# Patient Record
Sex: Male | Born: 1960 | Race: White | Hispanic: No | Marital: Single | Smoking: Never smoker
Health system: Southern US, Community
[De-identification: ages and names within clinical notes are randomized; demographics above are authoritative.]

## PROBLEM LIST (undated history)

## (undated) DIAGNOSIS — T7840XA Allergy, unspecified, initial encounter: Secondary | ICD-10-CM

## (undated) DIAGNOSIS — N529 Male erectile dysfunction, unspecified: Secondary | ICD-10-CM

## (undated) DIAGNOSIS — I82409 Acute embolism and thrombosis of unspecified deep veins of unspecified lower extremity: Secondary | ICD-10-CM

## (undated) DIAGNOSIS — D689 Coagulation defect, unspecified: Secondary | ICD-10-CM

## (undated) DIAGNOSIS — E785 Hyperlipidemia, unspecified: Secondary | ICD-10-CM

## (undated) DIAGNOSIS — H269 Unspecified cataract: Secondary | ICD-10-CM

## (undated) HISTORY — PX: CATARACT EXTRACTION, BILATERAL: SHX1313

## (undated) HISTORY — DX: Unspecified cataract: H26.9

## (undated) HISTORY — DX: Allergy, unspecified, initial encounter: T78.40XA

## (undated) HISTORY — DX: Hyperlipidemia, unspecified: E78.5

## (undated) HISTORY — DX: Male erectile dysfunction, unspecified: N52.9

## (undated) HISTORY — DX: Coagulation defect, unspecified: D68.9

## (undated) HISTORY — DX: Acute embolism and thrombosis of unspecified deep veins of unspecified lower extremity: I82.409

## (undated) HISTORY — PX: WISDOM TOOTH EXTRACTION: SHX21

## (undated) HISTORY — PX: COLONOSCOPY: SHX174

## (undated) HISTORY — PX: POLYPECTOMY: SHX149

---

## 1997-01-21 ENCOUNTER — Encounter: Payer: Self-pay | Admitting: Family Medicine

## 1997-04-30 HISTORY — PX: TENDON REPAIR: SHX5111

## 2002-01-08 ENCOUNTER — Encounter: Payer: Self-pay | Admitting: Family Medicine

## 2002-01-08 LAB — CONVERTED CEMR LAB
Blood Glucose, Fasting: 96 mg/dL
RBC count: 5.28 10*6/uL
TSH: 2.18 microintl units/mL
WBC, blood: 4.2 10*3/uL

## 2003-01-08 ENCOUNTER — Encounter: Payer: Self-pay | Admitting: Family Medicine

## 2003-01-08 LAB — CONVERTED CEMR LAB
Blood Glucose, Fasting: 98 mg/dL
Blood Glucose, Fasting: 98 mg/dL

## 2004-05-12 ENCOUNTER — Ambulatory Visit: Payer: Self-pay | Admitting: Family Medicine

## 2004-05-12 LAB — CONVERTED CEMR LAB
RBC count: 5.13 10*6/uL
WBC, blood: 5 10*3/uL

## 2004-05-19 ENCOUNTER — Ambulatory Visit: Payer: Self-pay | Admitting: Family Medicine

## 2005-05-25 ENCOUNTER — Ambulatory Visit: Payer: Self-pay | Admitting: Family Medicine

## 2005-05-25 LAB — CONVERTED CEMR LAB
Blood Glucose, Fasting: 103 mg/dL
Microalbumin U total vol: 0.9 mg/L
TSH: 3.14 microintl units/mL

## 2005-06-08 ENCOUNTER — Ambulatory Visit: Payer: Self-pay | Admitting: Family Medicine

## 2005-11-30 ENCOUNTER — Ambulatory Visit: Payer: Self-pay | Admitting: Family Medicine

## 2006-06-14 ENCOUNTER — Ambulatory Visit: Payer: Self-pay | Admitting: Family Medicine

## 2006-06-14 LAB — CONVERTED CEMR LAB
AST: 20 units/L (ref 0–37)
Albumin: 4.1 g/dL (ref 3.5–5.2)
Bilirubin, Direct: 0.1 mg/dL (ref 0.0–0.3)
Chloride: 102 meq/L (ref 96–112)
Cholesterol: 168 mg/dL (ref 0–200)
GFR calc non Af Amer: 70 mL/min
Glucose, Bld: 102 mg/dL — ABNORMAL HIGH (ref 70–99)
HDL: 39.3 mg/dL (ref >39.0)
LDL Cholesterol: 117 mg/dL — ABNORMAL HIGH (ref 0–99)
Potassium: 3.9 meq/L (ref 3.5–5.1)
Sodium: 141 meq/L (ref 135–145)
TSH: 4.56 microintl units/mL (ref 0.35–5.50)
Total CHOL/HDL Ratio: 4.3
Triglycerides: 61 mg/dL (ref 0–149)
VLDL: 12 mg/dL (ref 0–40)

## 2006-06-21 ENCOUNTER — Ambulatory Visit: Payer: Self-pay | Admitting: Family Medicine

## 2006-09-12 ENCOUNTER — Telehealth (INDEPENDENT_AMBULATORY_CARE_PROVIDER_SITE_OTHER): Payer: Self-pay | Admitting: *Deleted

## 2007-07-02 ENCOUNTER — Ambulatory Visit: Payer: Self-pay | Admitting: Family Medicine

## 2007-07-03 LAB — CONVERTED CEMR LAB
ALT: 21 units/L (ref 0–53)
Alkaline Phosphatase: 70 units/L (ref 39–117)
BUN: 17 mg/dL (ref 6–23)
Bilirubin, Direct: 0.1 mg/dL (ref 0.0–0.3)
CO2: 29 meq/L (ref 19–32)
Calcium: 9.5 mg/dL (ref 8.4–10.5)
Cholesterol: 160 mg/dL (ref 0–200)
Creatinine, Ser: 1.2 mg/dL (ref 0.4–1.5)
Glucose, Bld: 95 mg/dL (ref 70–99)
Potassium: 4.2 meq/L (ref 3.5–5.1)
Total Bilirubin: 0.8 mg/dL (ref 0.3–1.2)
Total CHOL/HDL Ratio: 4.2
Total Protein: 6.6 g/dL (ref 6.0–8.3)
Triglycerides: 39 mg/dL (ref 0–149)

## 2007-07-04 ENCOUNTER — Ambulatory Visit: Payer: Self-pay | Admitting: Family Medicine

## 2007-07-04 ENCOUNTER — Encounter: Payer: Self-pay | Admitting: Family Medicine

## 2007-07-04 DIAGNOSIS — E749 Disorder of carbohydrate metabolism, unspecified: Secondary | ICD-10-CM | POA: Insufficient documentation

## 2007-07-04 DIAGNOSIS — E7800 Pure hypercholesterolemia, unspecified: Secondary | ICD-10-CM | POA: Insufficient documentation

## 2008-07-07 ENCOUNTER — Ambulatory Visit: Payer: Self-pay | Admitting: Family Medicine

## 2008-07-08 LAB — CONVERTED CEMR LAB
AST: 19 units/L (ref 0–37)
Bilirubin, Direct: 0.1 mg/dL (ref 0.0–0.3)
Chloride: 103 meq/L (ref 96–112)
Creatinine, Ser: 1.1 mg/dL (ref 0.4–1.5)
Creatinine,U: 203.1 mg/dL
GFR calc non Af Amer: 77 mL/min
LDL Cholesterol: 110 mg/dL — ABNORMAL HIGH (ref 0–99)
PSA: 0.22 ng/mL (ref 0.10–4.00)
Potassium: 4 meq/L (ref 3.5–5.1)
Sodium: 139 meq/L (ref 135–145)
TSH: 3.34 microintl units/mL (ref 0.35–5.50)
Total Bilirubin: 1.4 mg/dL — ABNORMAL HIGH (ref 0.3–1.2)
Total CHOL/HDL Ratio: 4.9
Triglycerides: 72 mg/dL (ref 0–149)

## 2008-07-12 ENCOUNTER — Ambulatory Visit: Payer: Self-pay | Admitting: Family Medicine

## 2009-07-14 ENCOUNTER — Ambulatory Visit: Payer: Self-pay | Admitting: Family Medicine

## 2009-07-14 ENCOUNTER — Telehealth (INDEPENDENT_AMBULATORY_CARE_PROVIDER_SITE_OTHER): Payer: Self-pay | Admitting: *Deleted

## 2009-07-14 LAB — CONVERTED CEMR LAB
AST: 26 units/L (ref 0–37)
Albumin: 3.9 g/dL (ref 3.5–5.2)
Alkaline Phosphatase: 65 units/L (ref 39–117)
CO2: 31 meq/L (ref 19–32)
Calcium: 9 mg/dL (ref 8.4–10.5)
Glucose, Bld: 99 mg/dL (ref 70–99)
HDL: 38.2 mg/dL — ABNORMAL LOW (ref >39.00)
Potassium: 4.2 meq/L (ref 3.5–5.1)
Sodium: 140 meq/L (ref 135–145)
Total Protein: 6.2 g/dL (ref 6.0–8.3)

## 2009-07-28 ENCOUNTER — Ambulatory Visit: Payer: Self-pay | Admitting: Family Medicine

## 2009-12-06 ENCOUNTER — Encounter (INDEPENDENT_AMBULATORY_CARE_PROVIDER_SITE_OTHER): Payer: Self-pay | Admitting: *Deleted

## 2010-05-04 ENCOUNTER — Telehealth: Payer: Self-pay | Admitting: Family Medicine

## 2010-05-30 NOTE — Letter (Signed)
Summary: Nadara Eaton letter  [place] [facility]  [address] [place], [place] [address]   Phone: [phone]  Fax: [phone]       12/06/2009 MRN: [id number]  Christopher Gray [address] [place], [place]  [address]  Dear [name] and [facility] announce the retirement of Arta Silence, M.D., from full-time practice at the [facility] office effective October 27, 2009 and his plans of returning part-time.  It is important to Dr. Hetty Ely and to our practice that you understand that [facility] has seven physicians in our office for your health care needs.  We will continue to offer the same exceptional care that you have today.    Dr. Hetty Ely has spoken to many of you about his plans for retirement and returning part-time in the fall.   We will continue to work with you through the transition to schedule appointments for you in the office and meet the high standards that [facility] is committed to.   Again, it is with great pleasure that we share the news that Dr. Hetty Ely will return to [facility] in October of 2011 with a reduced schedule.    If you have any questions, or would like to request an appointment with one of our physicians, please call us at [phone] and press the option for Scheduling an appointment.  We take pleasure in providing you with excellent patient care and look forward to seeing you at your next office visit.  Our [facility] Physicians are:  Tillman Abide, M.D. Laurita Quint, M.D. Roxy Manns, M.D. Kerby Nora, M.D. Hannah Beat, M.D. Ruthe Mannan, M.D. We proudly welcomed Raechel Ache, M.D. and Eustaquio Boyden, M.D. to the practice in July/August 2011.  Sincerely,  [facility]

## 2010-05-30 NOTE — Assessment & Plan Note (Signed)
Summary: CPX/RBH  R/S FROM 07/18/09   Vital Signs:  Patient profile:   50 year old male Height:      72 inches Weight:      195.75 pounds BMI:     26.64 Temp:     97.9 degrees F oral Pulse rate:   60 / minute Pulse rhythm:   regular BP sitting:   110 / 70  (left arm) Cuff size:   regular  Vitals Entered By: Sydell Axon LPN (July 28, 2009 10:52 AM) CC: 30 Minute checkup   History of Present Illness: Pt here for Comp Exam. He has rash that pops up and go away for a few days.  He has a mole on his back. His finger was hit in the mail and had a paper cut a few weeks ago. He started soaking in Epsom salts. It is getting better.   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 1 Daily By Mouth 2)  Glucosamine Chondr 500 Complex   Caps (Glucosamine-Chondroit-Vit C-Mn) .Marland Kitchen.. 1 Daily By Mouth 3)  Fish Oil 1000 Mg Caps (Omega-3 Fatty Acids) .... Twice A Day To Three Times Per Day  Allergies: No Known Drug Allergies  Past History:  Past Surgical History: Last updated: 07/04/2007 Crushed right little finger with tendon repair  1999  Family History: Last updated: 07/12/2008 Father dec 73 Brain tumors  Prior smoker(stroke lung cancer with brain mets.) MOTHER dec  60s UNKNOWN PRIMARY CANCER--BONE CANCER Sister dec 43 Ovarian Ca CV: NEGATIVE HBP: NEGATIVE DM:  NEGATIVE GOUT/ARTHRITIS: PROSTATE CANCER: LUNG CANCER FATHER?? POSS. MOTHER ALSO BREAST/OVARIAN/UTERINE CANCER: +MOTHER BONE CANCER// +SISTER  OVARIAN CANCER COLON CANCER: DEPRESSION: NEGATIVE ETOH/DRUG ABUSE : NEGATIVE OTHER: +STROKE, FATHER  Social History: Last updated: 07/28/2009 Single, active sexually one partner 5 years Occupation:[employer]  Risk Factors: Alcohol Use: 0 (07/28/2009) Caffeine Use: 1-2 (07/28/2009) Exercise: yes (07/28/2009)  Risk Factors: Smoking Status: never (07/28/2009) Passive Smoke Exposure: no (07/28/2009)  Social History: Single, active sexually one partner 5 years Occupation:[employer]  Review of Systems General:  Complains of fatigue; denies chills, fever, sweats, weakness, and weight loss; Doesn't get enough sleep.. Eyes:  Complains of blurring; denies discharge and eye pain; right eye fuzzy. Used to see Dr Orson Slick. Now goes to Dr in [place].. ENT:  Denies decreased hearing, earache, and ringing in ears. CV:  Denies chest pain or discomfort, fainting, palpitations, shortness of breath with exertion, and swelling of feet. Resp:  Denies chest pain with inspiration, cough, shortness of breath, and wheezing. GI:  Denies abdominal pain, bloody stools, change in bowel habits, constipation, dark tarry stools, diarrhea, indigestion, loss of appetite, nausea, vomiting, vomiting blood, and yellowish skin color. GU:  Complains of nocturia; denies discharge, dysuria, and urinary frequency; one to two. MS:  Complains of low back pain; denies joint  pain, muscle aches, cramps, and stiffness; occas from working out.. Derm:  Complains of rash; denies dryness and itching; wandering single papule inflamm. Neuro:  Denies memory loss, numbness, poor balance, tingling, and tremors.  Physical Exam  General:  Well-developed,well-nourished,in no acute distress; alert,appropriate and cooperative throughout examination Head:   Normocephalic and atraumatic without obvious abnormalities. No apparent alopecia or balding. Eyes:  Conjunctiva clear bilaterally.  Ears:  External ear exam shows no significant lesions or deformities.  Otoscopic examination reveals clear canals, tympanic membranes are intact bilaterally without bulging, retraction, inflammation or discharge. Hearing is grossly normal bilaterally. Nose:  External nasal examination shows no deformity or inflammation. Nasal mucosa are pink and moist without lesions or exudates. Mouth:  Oral mucosa and oropharynx without lesions or exudates.  Teeth in good repair. Neck:  No deformities, masses, or tenderness noted. Chest Wall:  No deformities, masses, tenderness or gynecomastia noted. Breasts:  No masses or gynecomastia noted Lungs:  Normal respiratory effort, chest expands symmetrically. Lungs are clear to auscultation, no crackles or wheezes. Heart:  Normal rate and regular rhythm. S1 and S2 normal without gallop, murmur, click, rub or other extra sounds. Abdomen:  Bowel sounds positive,abdomen soft and non-tender without masses, organomegaly or hernias noted. Rectal:  No external abnormalities noted. Normal sphincter tone. No rectal masses or tenderness. G neg. Genitalia:  Testes bilaterally descended without nodularity, tenderness or masses. No scrotal masses or lesions. No penis lesions or urethral discharge. Prostate:  Prostate gland firm and smooth, no enlargement, nodularity, tenderness, mass, asymmetry or induration. 10-20gm. Msk:  No deformity or scoliosis noted of thoracic or lumbar spine.   Pulses:  R and L carotid,radial,femoral,dorsalis pedis and posterior tibial pulses are full and equal bilaterally Extremities:  No clubbing, cyanosis, edema, or deformity noted with normal full range of motion of all joints.   Neurologic:  No cranial nerve deficits noted. Station and gait are normal. Sensory, motor and coordinative functions appear intact. Skin:  Intact  without suspicious lesions or rashes, multiple benign moles of back with a few AKs. Acute papular eruption on left dorsal hand and improving inflammation of right hand middle finger paranychial area.   Impression & Recommendations:  Problem # 1:  HEALTH MAINTENANCE EXAM (ICD-V70.0) Assessment Comment Only  Reviewed preventive care protocols, scheduled due services, and updated immunizations. No imun needed. Discussed dietary measures. Avoid sweets and carbs.  Problem # 2:  SPECIAL SCREENING MALIGNANT NEOPLASM OF PROSTATE (ICD-V76.44) Assessment: Unchanged PSA and exam nml.  Problem # 3:  HYPERCHOLESTEROLEMIA (ICD-272.0) Assessment: Unchanged Adequate. 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  Complete Medication List: 1)  Multivitamins Tabs (Multiple vitamin) .Marland Kitchen.. 1 daily by mouth 2)  Glucosamine Chondr 500 Complex Caps (Glucosamine-chondroit-vit c-mn) .Marland Kitchen.. 1 daily by mouth 3)  Fish Oil 1000 Mg Caps (Omega-3 fatty acids) .... Twice a day to three times per day  Patient Instructions: 1)  Soak and then use Neosporin on hand papule abnd paranychial area. 2)  RTC prn..  Current Allergies (reviewed today): No known allergies   Appended Document: CPX/RBH  R/S FROM 07/18/09 Discussed Test deficiency. Would not replace at this point as minimal to no problems yet. Told will refer to Urology when becomes symptomatic.

## 2010-05-30 NOTE — Progress Notes (Signed)
Summary: add testosterone level to labs  Phone Note Call from Patient   Caller: Patient Summary of Call: pt having cpx labs today, he says he has several friends that are taking testosterone inj and his insurance will cover it since he is over 40. He wants to know if a testosterone level could be added to his labs.  let me know and we will add. Initial call taken by: Liane Comber CMA Duncan Dull),  July 14, 2009 9:25 AM  Follow-up for Phone Call        Can add ?diagnosis? "Over 40" won't work.  ?302.72? Follow-up by: Shaune Leeks MD,  July 14, 2009 11:34 AM  Additional Follow-up for Phone Call Additional follow up Details #1::        test added Liane Comber CMA Duncan Dull)  July 14, 2009 11:59 AM

## 2010-06-01 NOTE — Progress Notes (Signed)
Summary: wants testosterone checked  Phone Note Call from Patient   Caller: Patient Call For: Shaune Leeks MD Summary of Call: Pt is coming in for labs and physical in april and he is asking if he can have testosterone checked with the labs. Initial call taken by: Lowella Petties CMA, AAMA,  May 04, 2010 3:06 PM  Follow-up for Phone Call        Why? Is he having ED, fatigue, de[pression? I  sent flag to myself to remember to add to labs.. Follow-up by: Shaune Leeks MD,  May 04, 2010 4:11 PM  Additional Follow-up for Phone Call Additional follow up Details #1::        Pt says testosterone was on the low side last time it was checked.  He is not having any of the above problems.            Lowella Petties CMA, AAMA  May 04, 2010 4:50 PM     Additional Follow-up for Phone Call Additional follow up Details #2::    Noted. Follow-up by: Shaune Leeks MD,  May 04, 2010 4:58 PM

## 2010-06-21 ENCOUNTER — Encounter: Payer: Self-pay | Admitting: Family Medicine

## 2010-07-18 ENCOUNTER — Telehealth: Payer: Self-pay | Admitting: Family Medicine

## 2010-07-18 NOTE — Telephone Encounter (Signed)
Encounter opened in error

## 2010-08-01 ENCOUNTER — Telehealth: Payer: Self-pay | Admitting: Family Medicine

## 2010-08-01 NOTE — Telephone Encounter (Signed)
Will add testosterone to labs when ordered.

## 2010-08-03 ENCOUNTER — Other Ambulatory Visit: Payer: Self-pay | Admitting: Family Medicine

## 2010-08-03 ENCOUNTER — Other Ambulatory Visit (INDEPENDENT_AMBULATORY_CARE_PROVIDER_SITE_OTHER): Payer: BC Managed Care – PPO | Admitting: Family Medicine

## 2010-08-03 DIAGNOSIS — E749 Disorder of carbohydrate metabolism, unspecified: Secondary | ICD-10-CM

## 2010-08-03 DIAGNOSIS — E7800 Pure hypercholesterolemia, unspecified: Secondary | ICD-10-CM

## 2010-08-03 DIAGNOSIS — Z125 Encounter for screening for malignant neoplasm of prostate: Secondary | ICD-10-CM

## 2010-08-03 DIAGNOSIS — E785 Hyperlipidemia, unspecified: Secondary | ICD-10-CM

## 2010-08-03 LAB — BASIC METABOLIC PANEL
BUN: 22 mg/dL (ref 6–23)
Calcium: 8.9 mg/dL (ref 8.4–10.5)
Creatinine, Ser: 1.2 mg/dL (ref 0.4–1.5)
GFR: 72.14 mL/min (ref >60.00)

## 2010-08-03 LAB — LIPID PANEL
Cholesterol: 162 mg/dL (ref 0–200)
LDL Cholesterol: 112 mg/dL — ABNORMAL HIGH (ref 0–99)
Triglycerides: 45 mg/dL (ref 0.0–149.0)
VLDL: 9 mg/dL (ref 0.0–40.0)

## 2010-08-03 LAB — HEPATIC FUNCTION PANEL: Total Bilirubin: 1 mg/dL (ref 0.3–1.2)

## 2010-08-10 ENCOUNTER — Encounter: Payer: Self-pay | Admitting: Family Medicine

## 2010-08-10 ENCOUNTER — Ambulatory Visit (INDEPENDENT_AMBULATORY_CARE_PROVIDER_SITE_OTHER): Payer: BC Managed Care – PPO | Admitting: Family Medicine

## 2010-08-10 DIAGNOSIS — E7800 Pure hypercholesterolemia, unspecified: Secondary | ICD-10-CM

## 2010-08-10 DIAGNOSIS — E749 Disorder of carbohydrate metabolism, unspecified: Secondary | ICD-10-CM

## 2010-08-10 NOTE — Progress Notes (Signed)
  Subjective:    Patient ID: Christopher Gray, male    DOB: 09-25-1960, 49 y.o.   MRN: [id number]  HPI Pt here for Comp Exam. He has seen a optometrist in [place]. He thinks he may have cataracts.   Review of Systems  Constitutional: Negative for fever, chills, diaphoresis, appetite change, fatigue and unexpected weight change.  HENT: Negative for hearing loss, ear pain, tinnitus and ear discharge.   Eyes: Negative for pain and discharge. Visual disturbance: mild vision change per optometrist.   Respiratory: Negative for cough, shortness of breath and wheezing.   Cardiovascular: Negative for chest pain and palpitations.       No SOB w/ exertion  Gastrointestinal: Negative for nausea, vomiting, abdominal pain, diarrhea, constipation and blood in stool.       No heartburn or swallowing problems.  Genitourinary: Positive for frequency (nocturia once a night). Negative for dysuria and difficulty urinating.       No nocturia  Musculoskeletal: Negative for myalgias and back pain. Arthralgias: occas knee probs.   Skin: Negative for rash.       No itching or dryness.  Neurological: Negative for tremors and numbness.       No tingling or balance problems.  Hematological: Negative for adenopathy. Does not bruise/bleed easily.  Psychiatric/Behavioral: Negative for dysphoric mood and agitation.       Objective:   Physical Exam  Constitutional: He is oriented to person, place, and time. He appears well-developed and well-nourished. No distress.  HENT:  Head: Normocephalic and atraumatic.  Right Ear: External ear normal.  Left Ear: External ear normal.  Gray: Gray normal.  Mouth/Throat: Oropharynx is clear and moist.  Eyes: Conjunctivae and EOM are normal. Pupils are equal, round, and reactive to light. Right eye exhibits no discharge. Left eye exhibits no discharge. No scleral icterus.  Neck: Normal range of motion. Neck supple. No thyromegaly present.  Cardiovascular: Normal rate, regular  rhythm, normal heart sounds and intact distal pulses.   No murmur heard. Pulmonary/Chest: Effort normal and breath sounds normal. No respiratory distress. He has no wheezes.  Abdominal: Soft. Bowel sounds are normal. He exhibits no distension and no mass. There is no tenderness. There is no rebound and no guarding.  Genitourinary: Rectum normal, prostate normal and penis normal. Guaiac negative stool.       Prostate 10-20grms.  Musculoskeletal: Normal range of motion. He exhibits no edema.  Lymphadenopathy:    He has no cervical adenopathy.  Neurological: He is alert and oriented to person, place, and time. Coordination normal.  Skin: Skin is warm and dry. No rash noted. He is not diaphoretic.  Psychiatric: He has a normal mood and affect. His behavior is normal. Judgment and thought content normal.          Assessment & Plan:  Comp Exam

## 2010-08-10 NOTE — Assessment & Plan Note (Signed)
Good control. Cont current lifestyle. Lab Results  Component Value Date   CHOL 162 08/03/2010   CHOL 139 07/14/2009   CHOL 157 07/07/2008   Lab Results  Component Value Date   HDL 41.50 08/03/2010   HDL 38.20* 07/14/2009   HDL 32.3* 07/07/2008   Lab Results  Component Value Date   LDLCALC 112* 08/03/2010   LDLCALC 84 07/14/2009   LDLCALC 110* 07/07/2008   Lab Results  Component Value Date   TRIG 45.0 08/03/2010   TRIG 86.0 07/14/2009   TRIG 72 07/07/2008   Lab Results  Component Value Date   CHOLHDL 4 08/03/2010   CHOLHDL 4 07/14/2009   CHOLHDL 4.9 CALC 07/07/2008

## 2010-08-10 NOTE — Patient Instructions (Signed)
Cont as is. RTC one year, sooner as needed.

## 2010-08-10 NOTE — Assessment & Plan Note (Signed)
Euglycemic today. Has been well controlled for two years now. Discussed diet and approach to exercise again. He is putting into practice all that we have discussed.

## 2011-06-12 ENCOUNTER — Telehealth: Payer: Self-pay | Admitting: Family Medicine

## 2011-06-12 NOTE — Telephone Encounter (Signed)
Pt is calling to schedule a CPE and is a previous Dr. Hetty Ely pt. He was wondering if he could get testosterone check added to his labs for his CPE.

## 2011-06-12 NOTE — Telephone Encounter (Signed)
We need to discuss first at CPE.  Thanks.

## 2011-06-12 NOTE — Telephone Encounter (Signed)
LMOVM of cell phone. 

## 2011-07-05 ENCOUNTER — Other Ambulatory Visit (INDEPENDENT_AMBULATORY_CARE_PROVIDER_SITE_OTHER): Payer: BC Managed Care – PPO

## 2011-07-05 ENCOUNTER — Other Ambulatory Visit: Payer: Self-pay | Admitting: Family Medicine

## 2011-07-05 DIAGNOSIS — E7800 Pure hypercholesterolemia, unspecified: Secondary | ICD-10-CM

## 2011-07-05 LAB — COMPREHENSIVE METABOLIC PANEL
BUN: 23 mg/dL (ref 6–23)
CO2: 29 mEq/L (ref 19–32)
Calcium: 9 mg/dL (ref 8.4–10.5)
Chloride: 102 mEq/L (ref 96–112)
Creatinine, Ser: 1.4 mg/dL (ref 0.4–1.5)
GFR: 57.74 mL/min — ABNORMAL LOW (ref >60.00)
Glucose, Bld: 94 mg/dL (ref 70–99)
Total Bilirubin: 0.7 mg/dL (ref 0.3–1.2)

## 2011-07-05 LAB — LIPID PANEL
Cholesterol: 150 mg/dL (ref 0–200)
LDL Cholesterol: 95 mg/dL (ref 0–99)
Triglycerides: 49 mg/dL (ref 0.0–149.0)
VLDL: 9.8 mg/dL (ref 0.0–40.0)

## 2011-07-11 ENCOUNTER — Ambulatory Visit (INDEPENDENT_AMBULATORY_CARE_PROVIDER_SITE_OTHER): Payer: BC Managed Care – PPO | Admitting: Family Medicine

## 2011-07-11 ENCOUNTER — Encounter: Payer: Self-pay | Admitting: Family Medicine

## 2011-07-11 VITALS — BP 112/70 | HR 60 | Temp 97.2°F | Ht 71.5 in | Wt 205.2 lb

## 2011-07-11 DIAGNOSIS — N529 Male erectile dysfunction, unspecified: Secondary | ICD-10-CM

## 2011-07-11 DIAGNOSIS — Z0000 Encounter for general adult medical examination without abnormal findings: Secondary | ICD-10-CM

## 2011-07-11 NOTE — Patient Instructions (Signed)
Keep exercising.  I would cut back on the creatine and protein supplement.   Come back for the early morning testosterone.   Take care.  Glad to see you.

## 2011-07-11 NOTE — Progress Notes (Signed)
CPE- See plan.  Routine anticipatory guidance given to patient.  See health maintenance.  ED and dec in libido.  D/w pt about checking testosterone, would refer if low.  He understands.  No FH prostate CA.   Labs d/w pt.   PMH and SH reviewed  Meds, vitals, and allergies reviewed.   ROS: See HPI.  Otherwise negative.    GEN: nad, alert and oriented HEENT: mucous membranes moist NECK: supple w/o LA CV: rrr. PULM: ctab, no inc wob ABD: soft, +bs EXT: no edema SKIN: no acute rash

## 2011-07-12 ENCOUNTER — Encounter: Payer: Self-pay | Admitting: Family Medicine

## 2011-07-12 DIAGNOSIS — N529 Male erectile dysfunction, unspecified: Secondary | ICD-10-CM | POA: Insufficient documentation

## 2011-07-12 DIAGNOSIS — Z0000 Encounter for general adult medical examination without abnormal findings: Secondary | ICD-10-CM | POA: Insufficient documentation

## 2011-07-12 NOTE — Assessment & Plan Note (Signed)
Return for AM testosterone.  Refer to uro if low.

## 2011-07-12 NOTE — Assessment & Plan Note (Addendum)
Flu shot encouraged, continue exercise.  Td up to date.  Sugar and lipids are okay.  Healthy habits encouraged. Advised to cut back on protein load with cr 1.4.  This was likely elevated due to recent exercise, so I doubt it is significant, but he likely doesn't need the protein supplement.  D/w pt.

## 2012-07-02 ENCOUNTER — Other Ambulatory Visit: Payer: Self-pay | Admitting: Family Medicine

## 2012-07-02 DIAGNOSIS — E7800 Pure hypercholesterolemia, unspecified: Secondary | ICD-10-CM

## 2012-07-08 ENCOUNTER — Other Ambulatory Visit (INDEPENDENT_AMBULATORY_CARE_PROVIDER_SITE_OTHER): Payer: BC Managed Care – PPO

## 2012-07-08 DIAGNOSIS — N529 Male erectile dysfunction, unspecified: Secondary | ICD-10-CM

## 2012-07-08 DIAGNOSIS — E7800 Pure hypercholesterolemia, unspecified: Secondary | ICD-10-CM

## 2012-07-08 LAB — LIPID PANEL
HDL: 43.7 mg/dL (ref >39.00)
LDL Cholesterol: 83 mg/dL (ref 0–99)
Total CHOL/HDL Ratio: 3
Triglycerides: 61 mg/dL (ref 0.0–149.0)

## 2012-07-08 LAB — COMPREHENSIVE METABOLIC PANEL
ALT: 21 U/L (ref 0–53)
AST: 20 U/L (ref 0–37)
Albumin: 3.8 g/dL (ref 3.5–5.2)
Alkaline Phosphatase: 68 U/L (ref 39–117)
Calcium: 8.8 mg/dL (ref 8.4–10.5)
Chloride: 104 mEq/L (ref 96–112)
Creatinine, Ser: 1.2 mg/dL (ref 0.4–1.5)
Potassium: 3.8 mEq/L (ref 3.5–5.1)

## 2012-07-08 LAB — TESTOSTERONE: Testosterone: 460.29 ng/dL (ref 350.00–890.00)

## 2012-07-11 ENCOUNTER — Ambulatory Visit (INDEPENDENT_AMBULATORY_CARE_PROVIDER_SITE_OTHER): Payer: BC Managed Care – PPO | Admitting: Family Medicine

## 2012-07-11 ENCOUNTER — Encounter: Payer: Self-pay | Admitting: Family Medicine

## 2012-07-11 VITALS — BP 106/70 | HR 60 | Temp 97.3°F | Ht 72.0 in | Wt 190.5 lb

## 2012-07-11 DIAGNOSIS — Z1211 Encounter for screening for malignant neoplasm of colon: Secondary | ICD-10-CM

## 2012-07-11 DIAGNOSIS — Z0000 Encounter for general adult medical examination without abnormal findings: Secondary | ICD-10-CM

## 2012-07-11 DIAGNOSIS — Z23 Encounter for immunization: Secondary | ICD-10-CM

## 2012-07-11 NOTE — Patient Instructions (Addendum)
See Shirlee Limerick about your referral before you leave today. Take care.  Keep exercising.  Recheck in 1 year, sooner if needed.  I would get a flu shot each fall.

## 2012-07-11 NOTE — Assessment & Plan Note (Signed)
Routine anticipatory guidance given to patient.  See health maintenance. Tetanus 2004 Flu shot d/w pt. Prev done at pharmacy.   Labs d/w pt.  D/w pt about colon cancer screening.  Will refer.

## 2012-07-11 NOTE — Progress Notes (Signed)
CPE- See plan.  Routine anticipatory guidance given to patient.  See health maintenance. Tetanus 2004 Flu shot d/w pt. Prev done at pharmacy.   Labs d/w pt.  D/w pt about colon cancer screening.  Will refer.    D/w pt about his weight lifting and supplements.  He's clogging and taking glucosamine for his knees.  He's had some mild weight loss (~5 lbs recently).  He continues to exercise.    He has dx of cataracts and has had f/u with [facility].  He hasn't had surgery yet.  He is going to consider a second opinion.   PMH and SH reviewed  Meds, vitals, and allergies reviewed.   ROS: See HPI.  Otherwise negative.    GEN: nad, alert and oriented HEENT: mucous membranes moist NECK: supple w/o LA CV: rrr. PULM: ctab, no inc wob ABD: soft, +bs EXT: no edema SKIN: no acute rash

## 2012-07-14 ENCOUNTER — Encounter: Payer: Self-pay | Admitting: Internal Medicine

## 2012-09-08 ENCOUNTER — Encounter: Payer: BC Managed Care – PPO | Admitting: Internal Medicine

## 2013-03-11 ENCOUNTER — Telehealth: Payer: Self-pay

## 2013-03-11 DIAGNOSIS — Z1211 Encounter for screening for malignant neoplasm of colon: Secondary | ICD-10-CM

## 2013-03-11 NOTE — Telephone Encounter (Signed)
Pt left v/m requesting referral for colonoscopy in network before the end of the year. Pt request cb.

## 2013-03-11 NOTE — Telephone Encounter (Signed)
We referred him back in 3/14.  Another referral placed.   Have marion cancel this one if not needed.

## 2013-03-12 ENCOUNTER — Encounter: Payer: Self-pay | Admitting: Internal Medicine

## 2013-03-30 ENCOUNTER — Ambulatory Visit (AMBULATORY_SURGERY_CENTER): Payer: Self-pay | Admitting: *Deleted

## 2013-03-30 VITALS — Ht 72.0 in | Wt 201.2 lb

## 2013-03-30 DIAGNOSIS — Z1211 Encounter for screening for malignant neoplasm of colon: Secondary | ICD-10-CM

## 2013-03-30 MED ORDER — MOVIPREP 100 G PO SOLR
1.0000 | Freq: Once | ORAL | Status: DC
Start: 2013-03-30 — End: 2013-04-28

## 2013-03-30 NOTE — Progress Notes (Signed)
Denies allergies to eggs or soy products. Denies complications with sedation or anesthesia. 

## 2013-03-31 ENCOUNTER — Encounter: Payer: Self-pay | Admitting: Internal Medicine

## 2013-04-10 ENCOUNTER — Encounter: Payer: BC Managed Care – PPO | Admitting: Internal Medicine

## 2013-04-20 ENCOUNTER — Telehealth: Payer: Self-pay | Admitting: Internal Medicine

## 2013-04-20 ENCOUNTER — Encounter: Payer: Self-pay | Admitting: *Deleted

## 2013-04-20 NOTE — Telephone Encounter (Signed)
Updated prep instructions faxed to pt.  Pt gave permission for instructions to be faxed.  Pt will call to review instructions when he has received them.

## 2013-04-28 ENCOUNTER — Telehealth: Payer: Self-pay | Admitting: *Deleted

## 2013-04-28 ENCOUNTER — Encounter: Payer: Self-pay | Admitting: Internal Medicine

## 2013-04-28 ENCOUNTER — Ambulatory Visit (AMBULATORY_SURGERY_CENTER): Payer: BC Managed Care – PPO | Admitting: Internal Medicine

## 2013-04-28 VITALS — BP 103/52 | HR 59 | Temp 96.5°F | Resp 26 | Ht 73.0 in | Wt 201.0 lb

## 2013-04-28 DIAGNOSIS — D126 Benign neoplasm of colon, unspecified: Secondary | ICD-10-CM

## 2013-04-28 DIAGNOSIS — Z1211 Encounter for screening for malignant neoplasm of colon: Secondary | ICD-10-CM

## 2013-04-28 MED ORDER — SODIUM CHLORIDE 0.9 % IV SOLN: 500.0000 mL | INTRAVENOUS | Status: DC

## 2013-04-28 NOTE — Progress Notes (Signed)
Report to pacu rn, vss, bbs=clear 

## 2013-04-28 NOTE — Telephone Encounter (Signed)
Pt calling states he has to be here at 3 pm today for a procedure and he has a headache and he wanted to know if he can take something. Instructed he can take tylenol or ibuprofen, he may drink something caffeinated like soda. He also needs to make sure he has nothing after 1 pm.  Pt verbalized understanding of instructions given. ewm

## 2013-04-28 NOTE — Progress Notes (Signed)
Called to room to assist during endoscopic procedure.  Patient ID and intended procedure confirmed with present staff. Received instructions for my participation in the procedure from the performing physician. ewm 

## 2013-04-28 NOTE — Patient Instructions (Signed)
YOU HAD AN ENDOSCOPIC PROCEDURE TODAY AT THE [facility]: Refer to the procedure report that was given to you for any specific questions about what was found during the examination.  If the procedure report does not answer your questions, please call your gastroenterologist to clarify.  If you requested that your care partner not be given the details of your procedure findings, then the procedure report has been included in a sealed envelope for you to review at your convenience later.  YOU SHOULD EXPECT: Some feelings of bloating in the abdomen. Passage of more gas than usual.  Walking can help get rid of the air that was put into your GI tract during the procedure and reduce the bloating. If you had a lower endoscopy (such as a colonoscopy or flexible sigmoidoscopy) you may notice spotting of blood in your stool or on the toilet paper. If you underwent a bowel prep for your procedure, then you may not have a normal bowel movement for a few days.  DIET: Your first meal following the procedure should be a light meal and then it is ok to progress to your normal diet.  A half-sandwich or bowl of soup is an example of a good first meal.  Heavy or fried foods are harder to digest and may make you feel nauseous or bloated.  Likewise meals heavy in dairy and vegetables can cause extra gas to form and this can also increase the bloating.  Drink plenty of fluids but you should avoid alcoholic beverages for 24 hours.  ACTIVITY: Your care partner should take you home directly after the procedure.  You should plan to take it easy, moving slowly for the rest of the day.  You can resume normal activity the day after the procedure however you should NOT DRIVE or use heavy machinery for 24 hours (because of the sedation medicines used during the test).    SYMPTOMS TO REPORT IMMEDIATELY: A gastroenterologist can be reached at any hour.  During normal business hours, 8:30 AM to 5:00 PM Monday through Friday,  call [phone].  After hours and on weekends, please call the GI answering service at [phone] who will take a message and have the physician on call contact you.   Following lower endoscopy (colonoscopy or flexible sigmoidoscopy):  Excessive amounts of blood in the stool  Significant tenderness or worsening of abdominal pains  Swelling of the abdomen that is new, acute  Fever of 100F or higher  FOLLOW UP: If any biopsies were taken you will be contacted by phone or by letter within the next 1-3 weeks.  Call your gastroenterologist if you have not heard about the biopsies in 3 weeks.  Our staff will call the home number listed on your records the next business day following your procedure to check on you and address any questions or concerns that you may have at that time regarding the information given to you following your procedure. This is a courtesy call and so if there is no answer at the home number and we have not heard from you through the emergency physician on call, we will assume that you have returned to your regular daily activities without incident.  SIGNATURES/CONFIDENTIALITY: You and/or your care partner have signed paperwork which will be entered into your electronic medical record.  These signatures attest to the fact that that the information above on your After Visit Summary has been reviewed and is understood.  Full responsibility of the confidentiality of this   discharge information lies with you and/or your care-partner.  Recommendations See procedure report  

## 2013-04-28 NOTE — Op Note (Signed)
[facility] [address] [place], [address]   COLONOSCOPY PROCEDURE REPORT  PATIENT: Christopher Gray, Christopher Gray  MR#: [id number] BIRTHDATE: 1960-05-06 , 52  yrs. old GENDER: Male ENDOSCOPIST: Beverley Fiedler, MD REFERRED XB:JYNWGN Duncan, M.D. PROCEDURE DATE:  04/28/2013 PROCEDURE:   Colonoscopy with cold biopsy polypectomy and Colonoscopy with snare polypectomy First Screening Colonoscopy - Avg.  risk and is 50 yrs.  old or older Yes.  Prior Negative Screening - Now for repeat screening. N/A  History of Adenoma - Now for follow-up colonoscopy & has been > or = to 3 yrs.  N/A  Polyps Removed Today? Yes. ASA CLASS:   Class II INDICATIONS:average risk screening and first colonoscopy. MEDICATIONS: MAC sedation, administered by CRNA and propofol (Diprivan) 300mg  IV  DESCRIPTION OF PROCEDURE:   After the risks benefits and alternatives of the procedure were thoroughly explained, informed consent was obtained.  A digital rectal exam revealed no rectal mass.   The LB [id number]  endoscope was introduced through the anus and advanced to the cecum, which was identified by both the appendix and ileocecal valve. No adverse events experienced. The quality of the prep was good, using MoviPrep  The instrument was then slowly withdrawn as the colon was fully examined.   COLON FINDINGS: Five sessile polyps ranging between 3-43mm in size were found at the cecum (1), in the transverse colon (1), and sigmoid colon (3).  Polypectomy was performed with cold forceps (4) and using cold snare (1).  All resections were complete and all polyp tissue was completely retrieved.   There was mild scattered diverticulosis noted in the left colon.  Retroflexed views revealed no abnormalities. The time to cecum=4 minutes 45 seconds. Withdrawal time=13 minutes 10 seconds.  The scope was withdrawn and the procedure completed. COMPLICATIONS: There were no complications.  ENDOSCOPIC IMPRESSION: 1.    Five sessile polyps ranging between 3-42mm in size were found at the cecum, in the transverse colon, and sigmoid colon; Polypectomy was performed with cold forceps and using cold snare 2.   There was mild diverticulosis noted in the left colon  RECOMMENDATIONS: 1.  Await pathology results 2.  High fiber diet 3.  Timing of repeat colonoscopy will be determined by pathology findings. 4.  You will receive a letter within 1-2 weeks with the results of your biopsy as well as final recommendations.  Please call my office if you have not received a letter after 3 weeks.   eSigned:  Beverley Fiedler, MD 04/28/2013 4:50 PM      cc: The Patient and Crawford Givens, MD

## 2013-04-29 ENCOUNTER — Telehealth: Payer: Self-pay | Admitting: *Deleted

## 2013-04-29 NOTE — Telephone Encounter (Signed)
Message left

## 2013-05-05 ENCOUNTER — Encounter: Payer: Self-pay | Admitting: Internal Medicine

## 2013-06-24 ENCOUNTER — Telehealth: Payer: Self-pay | Admitting: *Deleted

## 2013-06-24 NOTE — Telephone Encounter (Signed)
Needed billing number provided

## 2013-07-06 ENCOUNTER — Other Ambulatory Visit: Payer: Self-pay | Admitting: Family Medicine

## 2013-07-06 DIAGNOSIS — E7800 Pure hypercholesterolemia, unspecified: Secondary | ICD-10-CM

## 2013-07-07 ENCOUNTER — Other Ambulatory Visit (INDEPENDENT_AMBULATORY_CARE_PROVIDER_SITE_OTHER): Payer: BC Managed Care – PPO

## 2013-07-07 DIAGNOSIS — E7800 Pure hypercholesterolemia, unspecified: Secondary | ICD-10-CM

## 2013-07-07 LAB — COMPREHENSIVE METABOLIC PANEL
ALT: 34 U/L (ref 0–53)
AST: 26 U/L (ref 0–37)
Albumin: 4 g/dL (ref 3.5–5.2)
Alkaline Phosphatase: 64 U/L (ref 39–117)
BUN: 19 mg/dL (ref 6–23)
CO2: 28 mEq/L (ref 19–32)
Calcium: 9 mg/dL (ref 8.4–10.5)
Chloride: 105 mEq/L (ref 96–112)
Creatinine, Ser: 1.3 mg/dL (ref 0.4–1.5)
GFR: 60.79 mL/min (ref >60.00)
Glucose, Bld: 98 mg/dL (ref 70–99)
Potassium: 4.3 mEq/L (ref 3.5–5.1)
Sodium: 141 mEq/L (ref 135–145)
Total Bilirubin: 1.1 mg/dL (ref 0.3–1.2)
Total Protein: 6.5 g/dL (ref 6.0–8.3)

## 2013-07-07 LAB — LIPID PANEL
Cholesterol: 161 mg/dL (ref 0–200)
HDL: 40.8 mg/dL (ref >39.00)
LDL Cholesterol: 106 mg/dL — ABNORMAL HIGH (ref 0–99)
Total CHOL/HDL Ratio: 4
Triglycerides: 70 mg/dL (ref 0.0–149.0)
VLDL: 14 mg/dL (ref 0.0–40.0)

## 2013-07-14 ENCOUNTER — Encounter: Payer: BC Managed Care – PPO | Admitting: Family Medicine

## 2013-07-17 ENCOUNTER — Ambulatory Visit (INDEPENDENT_AMBULATORY_CARE_PROVIDER_SITE_OTHER): Payer: BC Managed Care – PPO | Admitting: Family Medicine

## 2013-07-17 ENCOUNTER — Encounter: Payer: Self-pay | Admitting: Family Medicine

## 2013-07-17 VITALS — BP 118/62 | HR 80 | Temp 97.6°F | Ht 71.5 in | Wt 204.2 lb

## 2013-07-17 DIAGNOSIS — E7800 Pure hypercholesterolemia, unspecified: Secondary | ICD-10-CM

## 2013-07-17 DIAGNOSIS — Z0000 Encounter for general adult medical examination without abnormal findings: Secondary | ICD-10-CM

## 2013-07-17 NOTE — Assessment & Plan Note (Signed)
Reasonable labs, continue as is.  No change in meds. Labs d/w pt.  He agrees.

## 2013-07-17 NOTE — Patient Instructions (Addendum)
See if you need a referral to [facility] for the eye clinic.  Take care.  Keep exercising.  Glad to see you.

## 2013-07-17 NOTE — Progress Notes (Signed)
Pre visit review using our clinic review tool, if applicable. No additional management support is needed unless otherwise documented below in the visit note.  CPE- See plan.  Routine anticipatory guidance given to patient.  See health maintenance. Tetanus 2014 Flu shot prev done.  Colonoscopy 03/2013 Prostate cancer screening and PSA options (with potential risks and benefits of testing vs not testing) were discussed along with recent recs/guidelines.  He declined testing PSA at this point. Living will d/w pt.  Encouraged to consider.  Package given.   Diet and exercise d/w pt.  Doing well on both.  More weights than cardio.   Labs d/w pt.    Visions changes noted after cataract surgery, some blurry vision.  He was asking about referral.  This is reasonable.  He'll check on this and we can refer if needed.  PMH and SH reviewed  Meds, vitals, and allergies reviewed.   ROS: See HPI.  Otherwise negative.    GEN: nad, alert and oriented HEENT: mucous membranes moist NECK: supple w/o LA CV: rrr. PULM: ctab, no inc wob ABD: soft, +bs EXT: no edema SKIN: no acute rash

## 2013-07-17 NOTE — Assessment & Plan Note (Signed)
Routine anticipatory guidance given to patient.  See health maintenance. Tetanus 2014 Flu shot prev done.  Colonoscopy 03/2013 Prostate cancer screening and PSA options (with potential risks and benefits of testing vs not testing) were discussed along with recent recs/guidelines.  He declined testing PSA at this point. Living will d/w pt.  Encouraged to consider.  Package given.   Diet and exercise d/w pt.  Doing well on both.  More weights than cardio.   Labs d/w pt.

## 2014-04-01 ENCOUNTER — Encounter: Payer: Self-pay | Admitting: Family Medicine

## 2014-04-01 ENCOUNTER — Ambulatory Visit (INDEPENDENT_AMBULATORY_CARE_PROVIDER_SITE_OTHER): Payer: BC Managed Care – PPO | Admitting: Family Medicine

## 2014-04-01 VITALS — BP 108/72 | HR 60 | Temp 97.7°F | Wt 203.0 lb

## 2014-04-01 DIAGNOSIS — N529 Male erectile dysfunction, unspecified: Secondary | ICD-10-CM

## 2014-04-01 MED ORDER — SILDENAFIL CITRATE 100 MG PO TABS: 50.0000 mg | ORAL_TABLET | Freq: Every day | ORAL | Status: DC | PRN

## 2014-04-01 NOTE — Progress Notes (Signed)
Pre visit review using our clinic review tool, if applicable. No additional management support is needed unless otherwise documented below in the visit note.  He got back in a relationship, dating.  He had noted some ED, partial function.   His sex drive is still good.   He's had a lot of upheaval with getting a house built.   Nonsmoker, minimal etoh.  He does exercise.   He typically didn't get AM erections at baseline.  Minimal nocturia.  Stream is still good.    Meds, vitals, and allergies reviewed.   ROS: See HPI.  Otherwise, noncontributory.  nad ncat Mmm rrr ctab abd soft, not ttp Ext w/o edema.

## 2014-04-01 NOTE — Patient Instructions (Signed)
Try the viagra and then see if that helps.  Let me know if you have questions or concerns.  Take care.  Glad to see you.

## 2014-04-04 NOTE — Assessment & Plan Note (Signed)
There are sig potential side effects with T replacement, and libido is good, so no reason to check T level. D/w pt.  Can try viagra and then update me as needed.

## 2014-07-20 ENCOUNTER — Encounter: Payer: BC Managed Care – PPO | Admitting: Family Medicine

## 2014-11-22 ENCOUNTER — Other Ambulatory Visit: Payer: Self-pay | Admitting: Family Medicine

## 2014-11-22 DIAGNOSIS — E7800 Pure hypercholesterolemia, unspecified: Secondary | ICD-10-CM

## 2014-11-25 ENCOUNTER — Other Ambulatory Visit (INDEPENDENT_AMBULATORY_CARE_PROVIDER_SITE_OTHER): Payer: BLUE CROSS/BLUE SHIELD

## 2014-11-25 ENCOUNTER — Telehealth: Payer: Self-pay

## 2014-11-25 DIAGNOSIS — E7800 Pure hypercholesterolemia, unspecified: Secondary | ICD-10-CM

## 2014-11-25 LAB — LIPID PANEL
Cholesterol: 142 mg/dL (ref 0–200)
HDL: 42.4 mg/dL (ref >39.00)
LDL Cholesterol: 90 mg/dL (ref 0–99)
NonHDL: 99.26
Total CHOL/HDL Ratio: 3
Triglycerides: 45 mg/dL (ref 0.0–149.0)
VLDL: 9 mg/dL (ref 0.0–40.0)

## 2014-11-25 LAB — COMPREHENSIVE METABOLIC PANEL
ALT: 20 U/L (ref 0–53)
AST: 21 U/L (ref 0–37)
Albumin: 4.1 g/dL (ref 3.5–5.2)
Alkaline Phosphatase: 65 U/L (ref 39–117)
BUN: 22 mg/dL (ref 6–23)
CALCIUM: 9 mg/dL (ref 8.4–10.5)
CO2: 29 mEq/L (ref 19–32)
Chloride: 106 mEq/L (ref 96–112)
Creatinine, Ser: 1.25 mg/dL (ref 0.40–1.50)
GFR: 64.39 mL/min (ref >60.00)
GLUCOSE: 93 mg/dL (ref 70–99)
POTASSIUM: 4.1 meq/L (ref 3.5–5.1)
Sodium: 140 mEq/L (ref 135–145)
Total Bilirubin: 1 mg/dL (ref 0.2–1.2)
Total Protein: 6.4 g/dL (ref 6.0–8.3)

## 2014-11-25 NOTE — Telephone Encounter (Signed)
Pt left v/m; pt had lab test done earlier today; pt has appt on 12/02/14 for CPX; pt wants to know if had testosterone test done or not. Pt was not sure if needed and also not sure if ins would cover cost of testosterone test. Pt request cb. Appears pt had CMP and lipid.Please advise.

## 2014-11-25 NOTE — Telephone Encounter (Signed)
Patient advised.

## 2014-11-25 NOTE — Telephone Encounter (Signed)
I didn't order.  Have no idea if insurance will cover.  We can discuss at the [facility].

## 2014-12-02 ENCOUNTER — Encounter: Payer: Self-pay | Admitting: Family Medicine

## 2014-12-02 ENCOUNTER — Ambulatory Visit (INDEPENDENT_AMBULATORY_CARE_PROVIDER_SITE_OTHER): Payer: BLUE CROSS/BLUE SHIELD | Admitting: Family Medicine

## 2014-12-02 VITALS — BP 116/74 | HR 62 | Temp 98.0°F | Ht 72.0 in | Wt 206.5 lb

## 2014-12-02 DIAGNOSIS — Z7189 Other specified counseling: Secondary | ICD-10-CM | POA: Insufficient documentation

## 2014-12-02 DIAGNOSIS — Z0000 Encounter for general adult medical examination without abnormal findings: Secondary | ICD-10-CM | POA: Diagnosis not present

## 2014-12-02 NOTE — Progress Notes (Signed)
Pre visit review using our clinic review tool, if applicable. No additional management support is needed unless otherwise documented below in the visit note.  CPE- See plan.  Routine anticipatory guidance given to patient.  See health maintenance. Tetanus 2014 Flu shot encouraged PNA and shingles not due.   Colonoscopy 03/2013 Prostate cancer screening and PSA options (with potential risks and benefits of testing vs not testing) were discussed along with recent recs/guidelines. He declined testing PSA at this point. Living will d/w pt. Inez Catalina Cox designated if patient were incapacitated.   Diet and exercise d/w pt. Doing well on both. More weights than cardio.  Labs d/w pt.   ED hx, now resolved w/o meds.  He is functional without viagra.  Asking about T testing.  He doesn't have an indication for T testing or treatment at this point, so we deferred.  He agrees.   PMH and SH reviewed  Meds, vitals, and allergies reviewed.   ROS: See HPI.  Otherwise negative.    GEN: nad, alert and oriented HEENT: mucous membranes moist NECK: supple w/o LA CV: rrr. PULM: ctab, no inc wob ABD: soft, +bs EXT: no edema SKIN: no acute rash

## 2014-12-02 NOTE — Assessment & Plan Note (Addendum)
Routine anticipatory guidance given to patient.  See health maintenance. Tetanus 2014 Flu shot encouraged PNA and shingles not due.   Colonoscopy 03/2013 Prostate cancer screening and PSA options (with potential risks and benefits of testing vs not testing) were discussed along with recent recs/guidelines. He declined testing PSA at this point. Living will d/w pt. Inez Catalina Cox designated if patient were incapacitated.   Diet and exercise d/w pt. Doing well on both. More weights than cardio.  Labs d/w pt.

## 2014-12-02 NOTE — Patient Instructions (Signed)
Take care.  Glad to see you.   I would get a flu shot each fall.

## 2015-12-02 ENCOUNTER — Other Ambulatory Visit: Payer: Self-pay | Admitting: Family Medicine

## 2015-12-02 DIAGNOSIS — Z8639 Personal history of other endocrine, nutritional and metabolic disease: Secondary | ICD-10-CM

## 2015-12-05 ENCOUNTER — Other Ambulatory Visit (INDEPENDENT_AMBULATORY_CARE_PROVIDER_SITE_OTHER): Payer: BLUE CROSS/BLUE SHIELD

## 2015-12-05 DIAGNOSIS — Z8639 Personal history of other endocrine, nutritional and metabolic disease: Secondary | ICD-10-CM | POA: Diagnosis not present

## 2015-12-05 LAB — LIPID PANEL
CHOL/HDL RATIO: 3
Cholesterol: 126 mg/dL (ref 0–200)
HDL: 45.9 mg/dL (ref >39.00)
LDL Cholesterol: 69 mg/dL (ref 0–99)
NonHDL: 80.17
TRIGLYCERIDES: 56 mg/dL (ref 0.0–149.0)
VLDL: 11.2 mg/dL (ref 0.0–40.0)

## 2015-12-05 LAB — BASIC METABOLIC PANEL
BUN: 18 mg/dL (ref 6–23)
CALCIUM: 9 mg/dL (ref 8.4–10.5)
CO2: 29 meq/L (ref 19–32)
CREATININE: 1.17 mg/dL (ref 0.40–1.50)
Chloride: 105 mEq/L (ref 96–112)
GFR: 69.22 mL/min (ref >60.00)
Glucose, Bld: 97 mg/dL (ref 70–99)
Potassium: 3.9 mEq/L (ref 3.5–5.1)
SODIUM: 139 meq/L (ref 135–145)

## 2015-12-08 ENCOUNTER — Ambulatory Visit (INDEPENDENT_AMBULATORY_CARE_PROVIDER_SITE_OTHER): Payer: BLUE CROSS/BLUE SHIELD | Admitting: Family Medicine

## 2015-12-08 ENCOUNTER — Other Ambulatory Visit: Payer: Self-pay | Admitting: Family Medicine

## 2015-12-08 ENCOUNTER — Encounter: Payer: Self-pay | Admitting: Family Medicine

## 2015-12-08 VITALS — BP 120/70 | HR 70 | Ht 71.5 in | Wt 193.0 lb

## 2015-12-08 DIAGNOSIS — Z0000 Encounter for general adult medical examination without abnormal findings: Secondary | ICD-10-CM | POA: Diagnosis not present

## 2015-12-08 DIAGNOSIS — Z125 Encounter for screening for malignant neoplasm of prostate: Secondary | ICD-10-CM

## 2015-12-08 DIAGNOSIS — Z7189 Other specified counseling: Secondary | ICD-10-CM

## 2015-12-08 DIAGNOSIS — Z119 Encounter for screening for infectious and parasitic diseases, unspecified: Secondary | ICD-10-CM

## 2015-12-08 LAB — PSA: PSA: 0.31 ng/mL (ref 0.10–4.00)

## 2015-12-08 MED ORDER — CYCLOSPORINE 0.05 % OP EMUL: 1.0000 [drp] | Freq: Two times a day (BID) | OPHTHALMIC | Status: DC

## 2015-12-08 NOTE — Assessment & Plan Note (Signed)
Tetanus 2014 Flu shot encouraged.   PNA and shingles not due.   Colonoscopy 03/2013 Prostate cancer screening and PSA options (with potential risks and benefits of testing vs not testing) were discussed along with recent recs/guidelines.  He opted for testing PSA at this point.   Some nocturia, but had been drinking some soda recently, d/w pt.  Living will d/w pt. Christopher Gray designated if patient were incapacitated.   Diet and exercise d/w pt. Doing well on both. More weights than cardio.  Labs d/w pt.  Pt opts in for HIV screening.  D/w pt re: routine screening.   Pt opts in for HCV screening.  D/w pt re: routine screening.

## 2015-12-08 NOTE — Progress Notes (Signed)
CPE- See plan.  Routine anticipatory guidance given to patient.  See health maintenance. Tetanus 2014 Flu shot encouraged.   PNA and shingles not due.   Colonoscopy 03/2013 Prostate cancer screening and PSA options (with potential risks and benefits of testing vs not testing) were discussed along with recent recs/guidelines.  He opted for testing PSA at this point.   Some nocturia, but had been drinking some soda recently, d/w pt.  Living will d/w pt. Inez Catalina Cox designated if patient were incapacitated.   Diet and exercise d/w pt. Doing well on both. More weights than cardio.  Labs d/w pt.  Pt opts in for HIV screening.  D/w pt re: routine screening.   Pt opts in for HCV screening.  D/w pt re: routine screening.    PMH and SH reviewed  Meds, vitals, and allergies reviewed.   ROS: Per HPI.  Unless specifically indicated otherwise in HPI, the patient denies:  General: fever. Eyes: acute vision changes ENT: sore throat Cardiovascular: chest pain Respiratory: SOB GI: vomiting GU: dysuria Musculoskeletal: acute back pain Derm: acute rash Neuro: acute motor dysfunction Psych: worsening mood Endocrine: polydipsia Heme: bleeding Allergy: hayfever  GEN: nad, alert and oriented HEENT: mucous membranes moist NECK: supple w/o LA CV: rrr. PULM: ctab, no inc wob ABD: soft, +bs EXT: no edema SKIN: no acute rash L tibial tuberosity enlarged, slightly puffy.  Not ttp

## 2015-12-08 NOTE — Assessment & Plan Note (Signed)
Living will d/w pt. Christopher Gray designated if patient were incapacitated.

## 2015-12-08 NOTE — Patient Instructions (Addendum)
Go to the lab on the way out.  We'll contact you with your lab report. Take care.  Glad to see you.  Update me as needed.   

## 2015-12-09 LAB — HIV ANTIBODY (ROUTINE TESTING W REFLEX): HIV: NONREACTIVE

## 2015-12-09 LAB — HEPATITIS C ANTIBODY: HCV Ab: NEGATIVE

## 2016-11-02 ENCOUNTER — Emergency Department
Admission: EM | Admit: 2016-11-02 | Discharge: 2016-11-02 | Disposition: A | Discharge status: Home / Self Care | Payer: BLUE CROSS/BLUE SHIELD | Attending: Emergency Medicine | Admitting: Emergency Medicine

## 2016-11-02 ENCOUNTER — Telehealth: Payer: Self-pay

## 2016-11-02 ENCOUNTER — Emergency Department: Payer: BLUE CROSS/BLUE SHIELD

## 2016-11-02 ENCOUNTER — Encounter: Payer: Self-pay | Admitting: Emergency Medicine

## 2016-11-02 DIAGNOSIS — I824Z2 Acute embolism and thrombosis of unspecified deep veins of left distal lower extremity: Secondary | ICD-10-CM

## 2016-11-02 DIAGNOSIS — Z7982 Long term (current) use of aspirin: Secondary | ICD-10-CM | POA: Diagnosis not present

## 2016-11-02 DIAGNOSIS — I82402 Acute embolism and thrombosis of unspecified deep veins of left lower extremity: Secondary | ICD-10-CM | POA: Insufficient documentation

## 2016-11-02 DIAGNOSIS — Z79899 Other long term (current) drug therapy: Secondary | ICD-10-CM | POA: Diagnosis not present

## 2016-11-02 DIAGNOSIS — R2242 Localized swelling, mass and lump, left lower limb: Secondary | ICD-10-CM | POA: Diagnosis present

## 2016-11-02 LAB — COMPREHENSIVE METABOLIC PANEL
ALT: 21 U/L (ref 17–63)
AST: 23 U/L (ref 15–41)
Albumin: 4.2 g/dL (ref 3.5–5.0)
Alkaline Phosphatase: 64 U/L (ref 38–126)
Anion gap: 7 (ref 5–15)
BILIRUBIN TOTAL: 0.6 mg/dL (ref 0.3–1.2)
BUN: 22 mg/dL — ABNORMAL HIGH (ref 6–20)
CO2: 27 mmol/L (ref 22–32)
CREATININE: 1.34 mg/dL — AB (ref 0.61–1.24)
Calcium: 8.8 mg/dL — ABNORMAL LOW (ref 8.9–10.3)
Chloride: 106 mmol/L (ref 101–111)
GFR calc Af Amer: >60 mL/min (ref >60)
GFR, EST NON AFRICAN AMERICAN: 59 mL/min — AB (ref >60)
GLUCOSE: 93 mg/dL (ref 65–99)
Potassium: 4 mmol/L (ref 3.5–5.1)
Sodium: 140 mmol/L (ref 135–145)
TOTAL PROTEIN: 6.9 g/dL (ref 6.5–8.1)

## 2016-11-02 LAB — CBC
HEMATOCRIT: 44.9 % (ref 40.0–52.0)
HEMOGLOBIN: 15.4 g/dL (ref 13.0–18.0)
MCH: 31.4 pg (ref 26.0–34.0)
MCHC: 34.3 g/dL (ref 32.0–36.0)
MCV: 91.5 fL (ref 80.0–100.0)
Platelets: 211 10*3/uL (ref 150–440)
RBC: 4.91 MIL/uL (ref 4.40–5.90)
RDW: 12.3 % (ref 11.5–14.5)
WBC: 7 10*3/uL (ref 3.8–10.6)

## 2016-11-02 MED ORDER — RIVAROXABAN 15 MG PO TABS
15.0000 mg | ORAL_TABLET | Freq: Once | ORAL | Status: AC
  Administered 2016-11-02: 15 mg via ORAL
  Filled 2016-11-02: qty 1

## 2016-11-02 MED ORDER — RIVAROXABAN (XARELTO) VTE STARTER PACK (15 & 20 MG): ORAL_TABLET | ORAL | 0 refills | Status: DC

## 2016-11-02 NOTE — Telephone Encounter (Signed)
Pt calling; pt went to [facility] for lt swollen ankle and was sent to [facility] ED for eval. Pt is presently in [place] at [facility] to get US Venous Img lower lt leg; pt calling because he understands it would be less expensive to get Dr Damita Dunnings to call in order for [place]. Dr Damita Dunnings said pt should be evaluated, have [place] and then f/u with provider that evaluated pt in ED to determine what treatment is needed if any. Pt said he understands this could be serious and he will continue at ED. FYI to Dr Damita Dunnings.

## 2016-11-02 NOTE — ED Notes (Signed)
Patient given a Diet Coke and graham crackers and peanut butter.

## 2016-11-02 NOTE — ED Triage Notes (Signed)
Pt reports left leg swelling for a few days, increased pain with walking.

## 2016-11-02 NOTE — ED Notes (Signed)
Back in room from ultrasound.

## 2016-11-02 NOTE — Discharge Instructions (Signed)
Please follow-up with your doctor within the next 1 week for continued care. Please take your blood thinners starting tomorrow 11/03/16, as prescribed. Return to the emergency department for any chest pain, trouble breathing, or any other symptom personally concerning to yourself.

## 2016-11-02 NOTE — ED Provider Notes (Addendum)
[facility] Emergency Department Provider Note  Time seen: 3:56 PM  I have reviewed the triage vital signs and the nursing notes.   HISTORY  Chief Complaint Leg Swelling    HPI Christopher Gray is a 56 y.o. male with a past medical history of hyperlipidemia who presents the emergency department for left leg swelling and discomfort. According to the patient for the past 2 days he has noticed a pain in the back of his left calf with swelling and redness of his left foot. Denies any trauma. Denies any history of blood clots. Denies any chest pain or trouble breathing. Denies any known risk factors.  Past Medical History:  Diagnosis Date  . Cataracts, bilateral   . ED (erectile dysfunction)   . Hyperlipidemia     Patient Active Problem List   Diagnosis Date Noted  . Advance care planning 12/02/2014  . Routine general medical examination at a health care facility 07/12/2011    Past Surgical History:  Procedure Laterality Date  . CATARACT EXTRACTION, BILATERAL    . TENDON REPAIR  1999   Crushed right little finger  . WISDOM TOOTH EXTRACTION      Prior to Admission medications   Medication Sig Start Date End Date Taking? Authorizing Provider  aspirin 81 MG tablet Take 81 mg by mouth daily.    [provider]  cycloSPORINE (RESTASIS) 0.05 % ophthalmic emulsion Place 1 drop into both eyes 2 (two) times daily. 12/08/15   Tonia Ghent, MD  Multiple Vitamin (MULTIVITAMIN) tablet Take 1 tablet by mouth daily.      [provider]  Omega-3 Fatty Acids (FISH OIL) 1000 MG CAPS Take by mouth.    [provider]    No Known Allergies  Family History  Problem Relation Age of Onset  . Cancer Mother        bone cancer, unknown primary cancer  . Cancer Father        lung cancer with brain mets, prior smoker  . Stroke Father   . Cancer Sister        Ovarian cancer  . Prostate cancer Neg Hx   . Colon cancer Neg Hx   . Esophageal  cancer Neg Hx   . Rectal cancer Neg Hx   . Stomach cancer Neg Hx     Social History Social History  Substance Use Topics  . Smoking status: Never Smoker  . Smokeless tobacco: Never Used  . Alcohol use Yes     Comment: rare    Review of Systems Constitutional: Negative for fever. Cardiovascular: Negative for chest pain. Respiratory: Negative for shortness of breath. Gastrointestinal: Negative for abdominal pain Genitourinary: Negative for dysuria. Musculoskeletal: Negative for back pain. Skin: Negative for rash. Neurological: Negative for headache All other ROS negative  ____________________________________________   PHYSICAL EXAM:  VITAL SIGNS: ED Triage Vitals  Enc Vitals Group     BP 11/02/16 1403 (!) 126/59     Pulse Rate 11/02/16 1403 67     Resp 11/02/16 1403 16     Temp 11/02/16 1403 97.8 F (36.6 C)     Temp Source 11/02/16 1403 Oral     SpO2 11/02/16 1403 100 %     Weight 11/02/16 1403 195 lb (88.5 kg)     Height 11/02/16 1403 6' (1.829 m)     Head Circumference --      Peak Flow --      Pain Score 11/02/16 1402 7  Pain Loc --      Pain Edu? --      Excl. in [place]? --    Constitutional: Alert and oriented. Well appearing and in no distress. Eyes: Normal exam ENT   Head: Normocephalic and atraumatic   Mouth/Throat: Mucous membranes are moist. Cardiovascular: Normal rate, regular rhythm. No murmur Respiratory: Normal respiratory effort without tachypnea nor retractions. Breath sounds are clear Gastrointestinal: Soft and nontender. No distention.   Musculoskeletal: No calf tenderness. No popliteal tenderness. No thigh tenderness. Patient does have mild erythema and edema of the left lower extremity with no erythema or edema of the right lower extremity. Neurologic:  Normal speech and language. No gross focal neurologic deficits  Skin:  Skin is warm, dry and intact.  Psychiatric: Mood and affect are normal.    ____________________________________________     RADIOLOGY  Isolated left calf DVT.  ____________________________________________   INITIAL IMPRESSION / ASSESSMENT AND PLAN / ED COURSE  Pertinent labs & imaging results that were available during my care of the patient were reviewed by me and considered in my medical decision making (see chart for details).  Patient presents with 2 days of left calf pain with left foot redness and swelling. Positive for DVT on ultrasound. We will check basic labs and start on xarelto.  Patient denies any chest pain or trouble breathing. Normal physical exam besides mild edema and erythema of the left foot. tenderness on exam.  Labs are normal. Patient will be discharged with PCP follow-up.  ____________________________________________   FINAL CLINICAL IMPRESSION(S) / ED DIAGNOSES  DVT    Harvest Dark, MD 11/02/16 1600    Harvest Dark, MD 11/02/16 1725

## 2016-11-04 ENCOUNTER — Encounter: Payer: Self-pay | Admitting: Family Medicine

## 2016-11-04 ENCOUNTER — Telehealth: Payer: Self-pay | Admitting: Family Medicine

## 2016-11-04 DIAGNOSIS — Z86718 Personal history of other venous thrombosis and embolism: Secondary | ICD-10-CM | POA: Insufficient documentation

## 2016-11-04 NOTE — Telephone Encounter (Signed)
DVT seen on ER visit. Needs follow-up scheduled if TCM/ER f/u is not already arranged. Thanks.

## 2016-11-06 NOTE — Telephone Encounter (Signed)
Patient phoned back in and says he has an appointment for labs and a CPE in August and asks if he could wait to see Dr. Damita Dunnings at that time?  Patient is concerned about his copays and what he already owes to the ER and for purchase of his medication.  Patient states he will do what he needs to do but asks if you could give this some consideration?

## 2016-11-06 NOTE — Telephone Encounter (Signed)
I am way more worried about the DVT than his physical.  I would get the ER f/u when possible and push the CPE back.

## 2016-11-06 NOTE — Telephone Encounter (Signed)
Left detailed message on voicemail.  

## 2016-11-07 NOTE — Telephone Encounter (Signed)
Left message on voicemail for patient to call back. 

## 2016-11-08 NOTE — Telephone Encounter (Signed)
Left detailed message on voicemail as requested by patient on the first conversation.

## 2016-12-02 ENCOUNTER — Other Ambulatory Visit: Payer: Self-pay | Admitting: Family Medicine

## 2016-12-02 DIAGNOSIS — I82409 Acute embolism and thrombosis of unspecified deep veins of unspecified lower extremity: Secondary | ICD-10-CM

## 2016-12-02 DIAGNOSIS — Z125 Encounter for screening for malignant neoplasm of prostate: Secondary | ICD-10-CM

## 2016-12-03 ENCOUNTER — Telehealth: Payer: Self-pay | Admitting: *Deleted

## 2016-12-03 MED ORDER — RIVAROXABAN 20 MG PO TABS: 20.0000 mg | ORAL_TABLET | Freq: Every day | ORAL | 5 refills | Status: DC

## 2016-12-03 NOTE — Telephone Encounter (Signed)
Sent. Thanks.   

## 2016-12-03 NOTE — Telephone Encounter (Signed)
Spoke to pt who states he is needing a new Rx for 20mg  of xarelto. He was originally on a titration pak. Pt is requesting a call back when Rx sent. pls advise

## 2016-12-07 ENCOUNTER — Other Ambulatory Visit: Payer: BLUE CROSS/BLUE SHIELD

## 2016-12-10 ENCOUNTER — Other Ambulatory Visit (INDEPENDENT_AMBULATORY_CARE_PROVIDER_SITE_OTHER): Payer: BLUE CROSS/BLUE SHIELD

## 2016-12-10 DIAGNOSIS — I82409 Acute embolism and thrombosis of unspecified deep veins of unspecified lower extremity: Secondary | ICD-10-CM | POA: Diagnosis not present

## 2016-12-10 DIAGNOSIS — Z125 Encounter for screening for malignant neoplasm of prostate: Secondary | ICD-10-CM

## 2016-12-10 LAB — COMPREHENSIVE METABOLIC PANEL
ALBUMIN: 4.4 g/dL (ref 3.5–5.2)
ALK PHOS: 68 U/L (ref 39–117)
ALT: 18 U/L (ref 0–53)
AST: 17 U/L (ref 0–37)
BILIRUBIN TOTAL: 1 mg/dL (ref 0.2–1.2)
BUN: 21 mg/dL (ref 6–23)
CALCIUM: 9 mg/dL (ref 8.4–10.5)
CO2: 29 mEq/L (ref 19–32)
CREATININE: 1.27 mg/dL (ref 0.40–1.50)
Chloride: 104 mEq/L (ref 96–112)
GFR: 62.73 mL/min (ref >60.00)
Glucose, Bld: 91 mg/dL (ref 70–99)
Potassium: 4.2 mEq/L (ref 3.5–5.1)
SODIUM: 141 meq/L (ref 135–145)
TOTAL PROTEIN: 6 g/dL (ref 6.0–8.3)

## 2016-12-10 LAB — CBC WITH DIFFERENTIAL/PLATELET
Basophils Absolute: 0 10*3/uL (ref 0.0–0.1)
Basophils Relative: 0.5 % (ref 0.0–3.0)
EOS ABS: 0.2 10*3/uL (ref 0.0–0.7)
Eosinophils Relative: 3.8 % (ref 0.0–5.0)
HCT: 48 % (ref 39.0–52.0)
HEMOGLOBIN: 16 g/dL (ref 13.0–17.0)
LYMPHS ABS: 1.6 10*3/uL (ref 0.7–4.0)
Lymphocytes Relative: 30.3 % (ref 12.0–46.0)
MCHC: 33.4 g/dL (ref 30.0–36.0)
MCV: 94.4 fl (ref 78.0–100.0)
MONO ABS: 0.4 10*3/uL (ref 0.1–1.0)
Monocytes Relative: 8 % (ref 3.0–12.0)
NEUTROS PCT: 57.4 % (ref 43.0–77.0)
Neutro Abs: 3 10*3/uL (ref 1.4–7.7)
Platelets: 204 10*3/uL (ref 150.0–400.0)
RBC: 5.09 Mil/uL (ref 4.22–5.81)
RDW: 12.6 % (ref 11.5–15.5)
WBC: 5.3 10*3/uL (ref 4.0–10.5)

## 2016-12-10 LAB — PSA: PSA: 0.42 ng/mL (ref 0.10–4.00)

## 2016-12-13 ENCOUNTER — Encounter: Payer: Self-pay | Admitting: Family Medicine

## 2016-12-13 ENCOUNTER — Ambulatory Visit (INDEPENDENT_AMBULATORY_CARE_PROVIDER_SITE_OTHER): Payer: BLUE CROSS/BLUE SHIELD | Admitting: Family Medicine

## 2016-12-13 VITALS — BP 122/70 | HR 62 | Temp 98.2°F | Wt 202.2 lb

## 2016-12-13 DIAGNOSIS — Z0000 Encounter for general adult medical examination without abnormal findings: Secondary | ICD-10-CM | POA: Diagnosis not present

## 2016-12-13 DIAGNOSIS — I82409 Acute embolism and thrombosis of unspecified deep veins of unspecified lower extremity: Secondary | ICD-10-CM

## 2016-12-13 DIAGNOSIS — Z7189 Other specified counseling: Secondary | ICD-10-CM

## 2016-12-13 NOTE — Progress Notes (Signed)
CPE- See plan.  Routine anticipatory guidance given to patient.  See health maintenance.  The possibility exists that previously documented standard health maintenance information may have been brought forward from a previous encounter into this note.  If needed, that same information has been updated to reflect the current situation based on today's encounter.    Tetanus 2014 Flu shot encouraged.   PNA and shingles not due.  Colonoscopy 03/2013.   PSA wnl.   Living will d/w pt. He wants his neighbor Pamala Hurry designated if patient were incapacitated.  Diet and exercise d/w pt. Doing well on both. More weights than cardio.  Labs d/w pt.  HIV and HCV screening prev neg.   H/o cataracts, s/p YAG treatment thereafter.  Restasis didn't help with dry eye sx.  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  We discussed that he can get second opinion if needed, or follow-up with eye clinic.  DVT.  On anticoagulation.  He had some local irritation but no clear trigger.  No prev hx, no FH.  U/s with DVT noted.  No ADE on xarelto.  Compliant. No bleeding, no sig bruising.    Routine cautions with medication discussed with patient.  PMH and SH reviewed  Meds, vitals, and allergies reviewed.   ROS: Per HPI.  Unless specifically indicated otherwise in HPI, the patient denies:  General: fever. Eyes: acute vision changes ENT: sore throat Cardiovascular: chest pain Respiratory: SOB GI: vomiting GU: dysuria Musculoskeletal: acute back pain Derm: acute rash Neuro: acute motor dysfunction Psych: worsening mood Endocrine: polydipsia Heme: bleeding Allergy: hayfever  GEN: nad, alert and oriented HEENT: mucous membranes moist NECK: supple w/o LA CV: rrr. PULM: ctab, no inc wob ABD: soft, +bs EXT: no edema SKIN: no acute rash Calf not ttp B

## 2016-12-13 NOTE — Patient Instructions (Addendum)
Call back with Barbara's contact information when possible.   Let me see about options on the blood thinner- duration, options, etc.  Take care.  Glad to see you.

## 2016-12-14 NOTE — Assessment & Plan Note (Signed)
Living will d/w pt. He wants his neighbor Barbara designated if patient were incapacitated.   

## 2016-12-14 NOTE — Assessment & Plan Note (Signed)
No prev hx, no FH.  U/s with DVT noted prev.  No ADE on xarelto.  Compliant. No bleeding, no sig bruising.    Continue medications for now. I want to look back at options regarding duration, etc and notify pt.  Discussed with patient. Pathophysiology of DVT and treatment discussed with patient. He agrees.

## 2016-12-14 NOTE — Assessment & Plan Note (Signed)
Tetanus 2014 Flu shot encouraged.   PNA and shingles not due.  Colonoscopy 03/2013.   PSA wnl.   Living will d/w pt. He wants his neighbor Pamala Hurry designated if patient were incapacitated.  Diet and exercise d/w pt. Doing well on both. More weights than cardio.  Labs d/w pt.  HIV and HCV screening prev neg.

## 2016-12-17 ENCOUNTER — Telehealth: Payer: Self-pay | Admitting: Family Medicine

## 2016-12-17 NOTE — Telephone Encounter (Signed)
Notify pt.   With no prev hx and no FH, with this being his first episode, then he likely would benefit from 6 months of xarelto assuming he didn't have trouble with bleeding in the meantime.  He is unlikely to benefit from genetic testing.  It is likely reasonable to start and then continue aspirin 325mg  a day AFTER finishing the 6 months of xarelto, but NOT WHILE ON xarelto.    If he had a second clot later in life, then he would likely need lifelong/extended anticoagulation.  Thanks.

## 2016-12-17 NOTE — Telephone Encounter (Signed)
Left message on patient's voicemail to return call

## 2016-12-17 NOTE — Telephone Encounter (Signed)
Pt returned your call. He is available after 5pm today.

## 2016-12-19 NOTE — Telephone Encounter (Signed)
Patient notified as instructed by telephone and verbalized understanding. 

## 2016-12-19 NOTE — Telephone Encounter (Signed)
Pt returned your call Best number [phone]

## 2016-12-26 ENCOUNTER — Encounter: Payer: Self-pay | Admitting: Family Medicine

## 2017-01-31 ENCOUNTER — Encounter: Payer: Self-pay | Admitting: Family Medicine

## 2017-03-25 ENCOUNTER — Other Ambulatory Visit: Payer: Self-pay | Admitting: *Deleted

## 2017-03-25 NOTE — Telephone Encounter (Signed)
Faxed refill request. Xarelto Last office visit:   12/13/16 Last Filled:    30 tablet 5 12/03/2016  Please advise.

## 2017-03-26 NOTE — Telephone Encounter (Signed)
Left detailed message on voicemail of patient and verified from pharmacy that 2 RF's remain on original Rx.

## 2017-03-26 NOTE — Telephone Encounter (Signed)
Check with patient.  Should have refills left for 6 months of treatment.   It is likely reasonable to start and then continue aspirin 325mg  a day AFTER finishing the 6 months of xarelto, but NOT WHILE ON xarelto.   If he had a second clot later in life, then he would likely need lifelong/extended anticoagulation.  Thanks.

## 2017-03-27 NOTE — Telephone Encounter (Signed)
Left message on voicemail for patient to call back. 

## 2017-03-27 NOTE — Telephone Encounter (Signed)
I can't make him take the med.  If he has had an adverse effect from the med, then let me know. Otherwise, I would finish the 6 months of xarelto.   Stopping early is against medical advice.

## 2017-03-27 NOTE — Telephone Encounter (Signed)
He called back & said he understands that but he doesn't think that he needs to take it for two more months. He started taking July 6th. Call back @ [phone].

## 2017-03-28 ENCOUNTER — Encounter: Payer: Self-pay | Admitting: *Deleted

## 2017-03-28 NOTE — Telephone Encounter (Signed)
Pt called regarding taking Xarelto. Explained to the pt that he should continue to take the medication to complete the six months and then the day after completion he should start to take Aspirin 325mg . Pt verbalized understanding. For additional questions or concerns, the pt states he can be reached via a Mychart message, due to him being at work.

## 2017-03-29 ENCOUNTER — Other Ambulatory Visit: Payer: Self-pay | Admitting: *Deleted

## 2017-03-29 NOTE — Telephone Encounter (Signed)
Faxed refill request. Xarelto Last office visit:   12/13/2016 Last Filled:

## 2017-04-04 ENCOUNTER — Other Ambulatory Visit: Payer: Self-pay | Admitting: *Deleted

## 2017-04-04 NOTE — Telephone Encounter (Signed)
Faxed refill request. Xarelto  Requests 90 day supply Last office visit:   12/13/16 Last Filled:    30 tablet 5 12/03/2016  Requests 90 day supply.  Please advise.

## 2017-04-05 NOTE — Telephone Encounter (Signed)
Only needs 6 months of xarelto, so he should stop ~ 05/05/17.  Wouldn't need 90 day supply.  Thanks.

## 2017-04-10 NOTE — Telephone Encounter (Signed)
Left message on voicemail for patient to call back. 

## 2017-04-11 NOTE — Telephone Encounter (Signed)
Left detailed message on voicemail.  

## 2017-04-12 ENCOUNTER — Encounter: Payer: Self-pay | Admitting: *Deleted

## 2017-04-12 NOTE — Telephone Encounter (Signed)
Unable to reach patient on numerous occasions.  Letter mailed.

## 2017-04-16 ENCOUNTER — Telehealth: Payer: Self-pay | Admitting: Family Medicine

## 2017-04-16 NOTE — Telephone Encounter (Signed)
Pt of Dr. Damita Dunnings.  Wanting to see if an antibiotic for coughing could be called int?

## 2017-04-16 NOTE — Telephone Encounter (Signed)
Left message for patient to call back. Patient needs to be seen for office visit if antibiotic needed per Dr. Damita Dunnings.

## 2017-04-16 NOTE — Telephone Encounter (Signed)
Copied from [place] [id number]. Topic: Quick Communication - See Telephone Encounter >> Apr 16, 2017 11:51 AM Robina Ade, Helene Kelp D wrote: CRM for notification. See Telephone encounter for: 04/16/17. Patient would like to know if Dr. Damita Dunnings can call him in an antibiotic for coughing, mucus.

## 2017-04-21 ENCOUNTER — Encounter: Payer: Self-pay | Admitting: Family Medicine

## 2017-04-22 ENCOUNTER — Ambulatory Visit: Payer: BLUE CROSS/BLUE SHIELD | Admitting: Internal Medicine

## 2017-04-22 ENCOUNTER — Encounter: Payer: Self-pay | Admitting: Internal Medicine

## 2017-04-22 VITALS — BP 114/66 | HR 94 | Temp 98.1°F | Wt 196.5 lb

## 2017-04-22 DIAGNOSIS — R05 Cough: Secondary | ICD-10-CM

## 2017-04-22 DIAGNOSIS — R058 Other specified cough: Secondary | ICD-10-CM

## 2017-04-22 MED ORDER — BENZONATATE 200 MG PO CAPS: 200.0000 mg | ORAL_CAPSULE | Freq: Three times a day (TID) | ORAL | 1 refills | Status: DC | PRN

## 2017-04-22 NOTE — Patient Instructions (Signed)

## 2017-04-22 NOTE — Progress Notes (Signed)
Subjective:    Patient ID: Christopher Gray, male    DOB: 23-Feb-1961, 56 y.o.   MRN: [id number]  HPI  Pt presents to the clinic today with c/o cough and chest congestion. This started 3 weeks ago. The cough was productive of green mucous at one time, but is now just dry. He denies runny nose, nasal congestion, sore throat or shortness of breath. He denies fever, chills or body aches. He has taken Mucinex, Sudafed, Alka Seltzer with minimal relief. He has not had sick contacts.  Review of Systems  Past Medical History:  Diagnosis Date  . Cataracts, bilateral   . DVT (deep venous thrombosis) ([place])    1st event 2018.  no clear trigger.    . ED (erectile dysfunction)   . Hyperlipidemia     Current Outpatient Medications  Medication Sig Dispense Refill  . Ginkgo Biloba 100 MG CAPS Take by mouth.    . Multiple Vitamin (MULTIVITAMIN) tablet Take 1 tablet by mouth daily.      . rivaroxaban (XARELTO) 20 MG TABS tablet Take 1 tablet (20 mg total) by mouth daily with supper. 30 tablet 5  . benzonatate (TESSALON) 200 MG capsule Take 1 capsule (200 mg total) by mouth 3 (three) times daily as needed for cough. 30 capsule 1   No current facility-administered medications for this visit.     No Known Allergies  Family History  Problem Relation Age of Onset  . Cancer Mother        bone cancer, unknown primary cancer  . Cancer Father        lung cancer with brain mets, prior smoker  . Stroke Father   . Cancer Sister        Ovarian cancer  . Prostate cancer Neg Hx   . Colon cancer Neg Hx   . Esophageal cancer Neg Hx   . Rectal cancer Neg Hx   . Stomach cancer Neg Hx   . Deep vein thrombosis Neg Hx     Social History   Socioeconomic History  . Marital status: Single    Spouse name: Not on file  . Number of children: Not on file  . Years of education: Not on file  . Highest education level: Not on file  Social Needs  . Financial resource strain: Not on file  . Food insecurity -  worry: Not on file  . Food insecurity - inability: Not on file  . Transportation needs - medical: Not on file  . Transportation needs - non-medical: Not on file  Occupational History  . Occupation: Sales executive  Tobacco Use  . Smoking status: Never Smoker  . Smokeless tobacco: Never Used  Substance and Sexual Activity  . Alcohol use: Yes    Comment: rare  . Drug use: No  . Sexual activity: Not on file  Other Topics Concern  . 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  HEENT: Denies eye pain, eye redness, ear pain, ringing in the ears, wax buildup, runny nose, nasal congestion, bloody nose, or sore throat. Respiratory: Pt reports cough and chest congestion. Denies difficulty breathing, shortness of breath, .   Cardiovascular: Denies chest pain, chest tightness, palpitations or swelling in the hands or feet.    No other specific complaints in  a complete review of systems (except as listed in HPI above).     Objective:   Physical Exam  BP 114/66   Pulse 94   Temp 98.1 F (36.7 C) (Oral)   Wt 196 lb 8 oz (89.1 kg)   SpO2 93%   BMI 26.65 kg/m  Wt Readings from Last 3 Encounters:  04/22/17 196 lb 8 oz (89.1 kg)  12/13/16 202 lb 4 oz (91.7 kg)  11/02/16 195 lb (88.5 kg)    General: Appears his stated age, well developed, well nourished in NAD. HEENT: Head: normal shape and size, no sinus tenderness; Ears: Tm's gray and intact, normal light reflex; Nose: mucosa pink and moist, septum midline; Throat/Mouth: Teeth present, mucosa pink and moist, no exudate, lesions or ulcerations noted.  Neck:  No adenopathy noted. Pulmonary/Chest: Normal effort and positive vesicular breath sounds. No respiratory distress. No wheezes, rales or ronchi noted.   BMET    Component Value Date/Time    NA 141 12/10/2016 0845   K 4.2 12/10/2016 0845   CL 104 12/10/2016 0845   CO2 29 12/10/2016 0845   GLUCOSE 91 12/10/2016 0845   BUN 21 12/10/2016 0845   CREATININE 1.27 12/10/2016 0845   CALCIUM 9.0 12/10/2016 0845   GFRNONAA 59 (L) 11/02/2016 1555   GFRAA >60 11/02/2016 1555    Lipid Panel     Component Value Date/Time   CHOL 126 12/05/2015 0843   TRIG 56.0 12/05/2015 0843   HDL 45.90 12/05/2015 0843   CHOLHDL 3 12/05/2015 0843   VLDL 11.2 12/05/2015 0843   LDLCALC 69 12/05/2015 0843    CBC    Component Value Date/Time   WBC 5.3 12/10/2016 0845   RBC 5.09 12/10/2016 0845   HGB 16.0 12/10/2016 0845   HCT 48.0 12/10/2016 0845   PLT 204.0 12/10/2016 0845   MCV 94.4 12/10/2016 0845   MCH 31.4 11/02/2016 1555   MCHC 33.4 12/10/2016 0845   RDW 12.6 12/10/2016 0845   LYMPHSABS 1.6 12/10/2016 0845   MONOABS 0.4 12/10/2016 0845   EOSABS 0.2 12/10/2016 0845   BASOSABS 0.0 12/10/2016 0845    Hgb A1C No results found for: HGBA1C          Assessment & Plan:   Post Viral Cough Syndrome:  No indication for abx at this time He is over the worst of it, just now has this dry cough eRx for Tessalon Pearls  Return precautions discussed Webb Silversmith, NP

## 2017-04-25 ENCOUNTER — Encounter: Payer: Self-pay | Admitting: *Deleted

## 2017-05-02 ENCOUNTER — Encounter: Payer: Self-pay | Admitting: Family Medicine

## 2017-10-03 IMAGING — US US EXTREM LOW VENOUS*L*
1 series · 14 of 24 positions shown · non-contrast
Comparison: None

CLINICAL DATA: Left lower extremity  pain and swelling x3 days

EXAM:
LEFT LOWER EXTREMITY VENOUS DOPPLER ULTRASOUND
TECHNIQUE: Gray-scale sonography with compression, as well as color and duplex
ultrasound, were performed to evaluate the deep venous system from
the level of the common femoral vein through the popliteal and
proximal calf veins.

[Series 1: us extrem low venous*left* · 0.08mm/px · 14 of 46 slices shown]
[im 1/46]
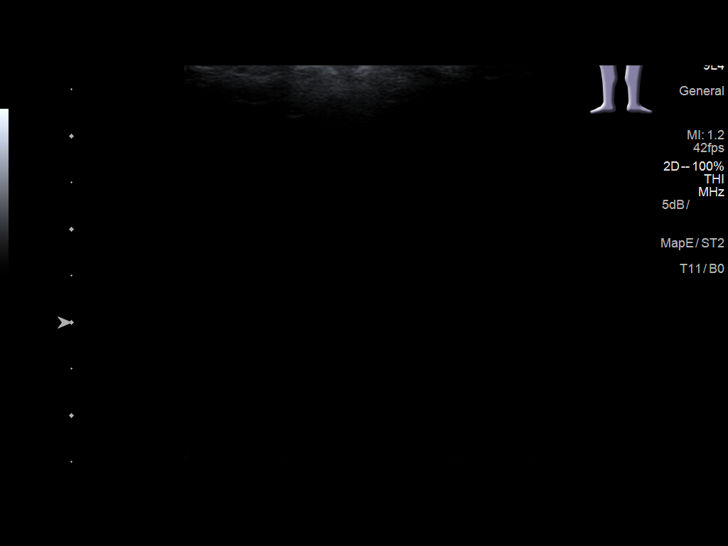
[im 4/46]
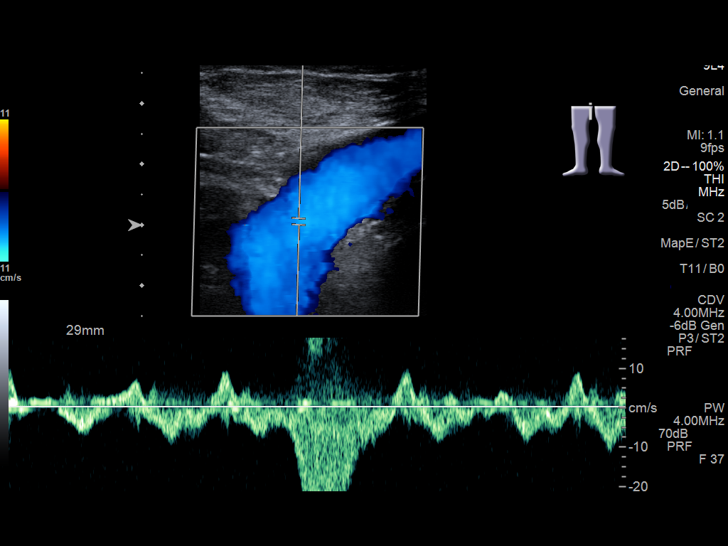
[im 8/46]
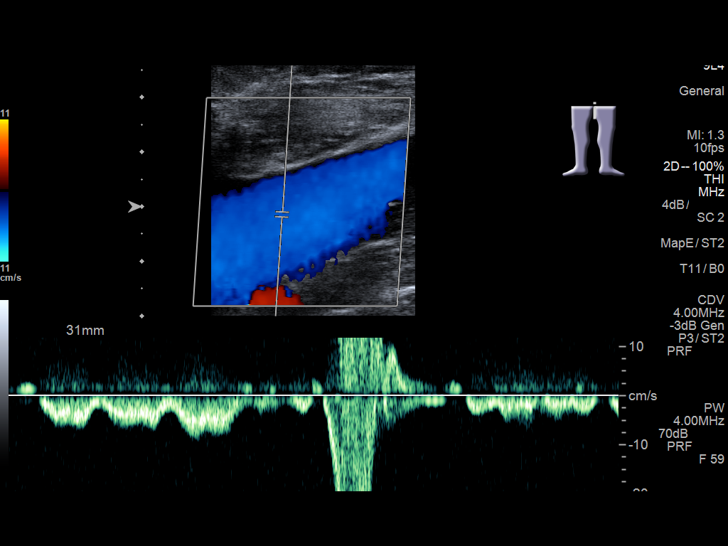
[im 12/46]
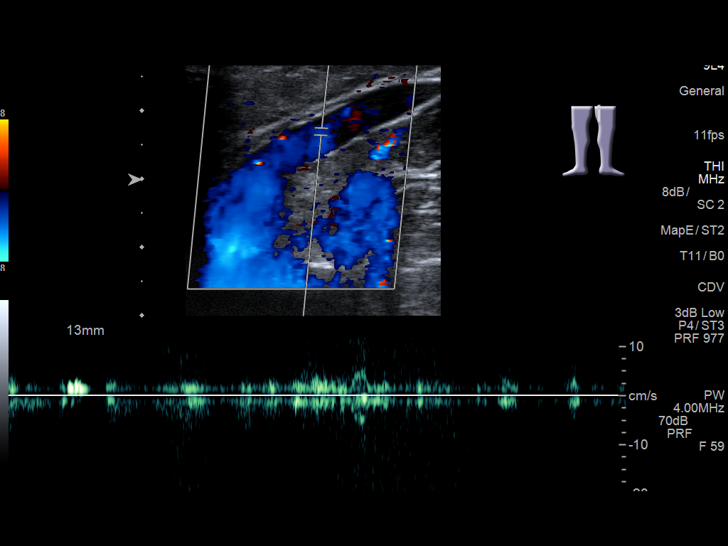
[im 14/46]
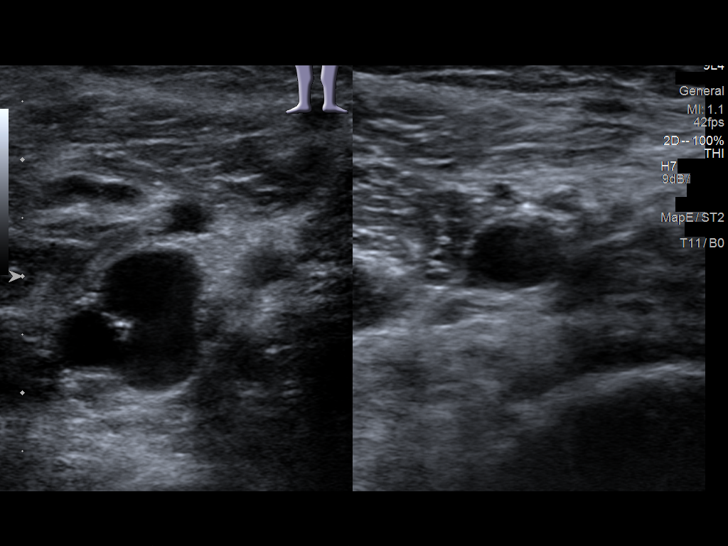
[im 18/46]
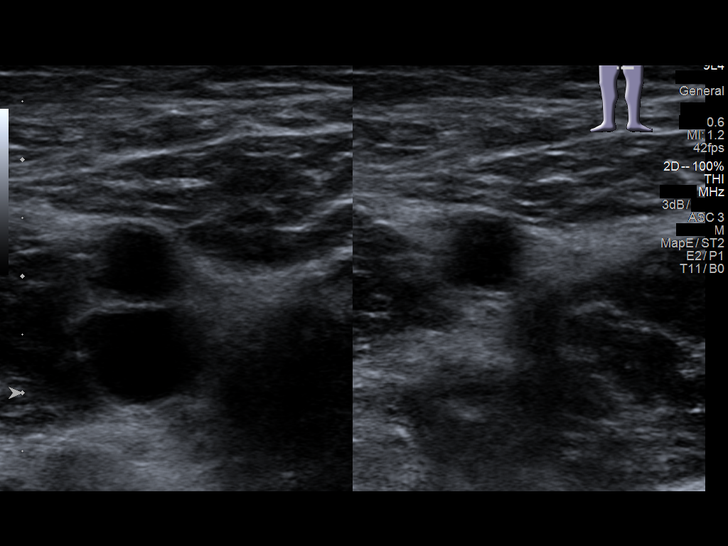
[im 22/46]
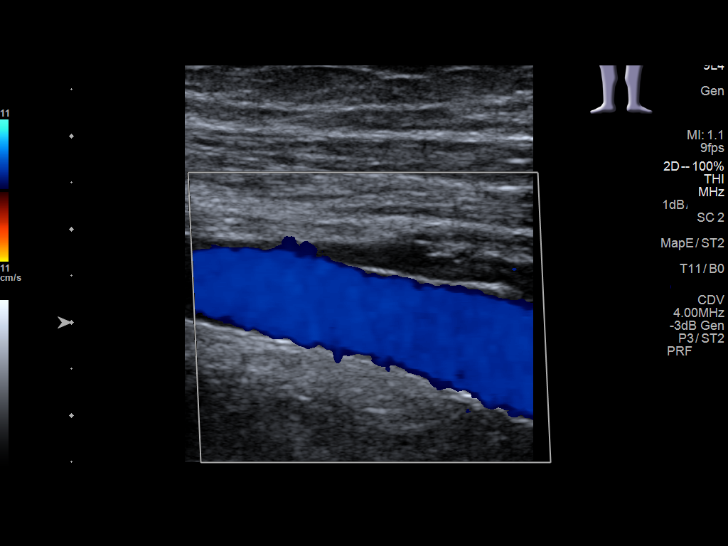
[im 24/46]
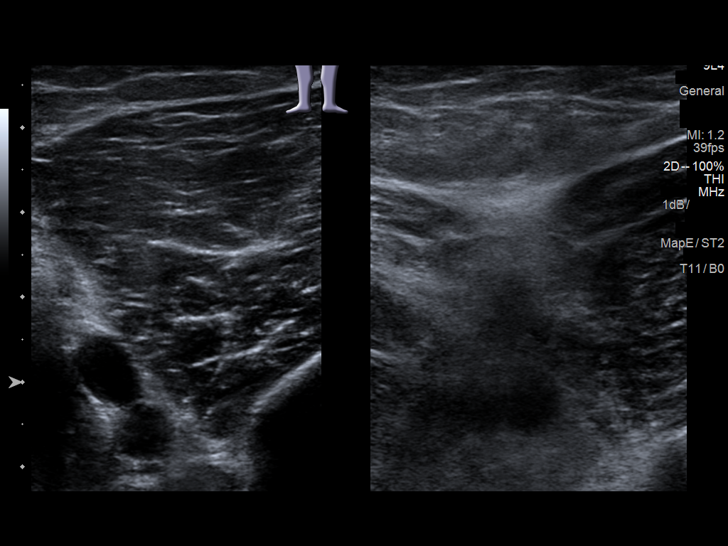
[im 28/46]
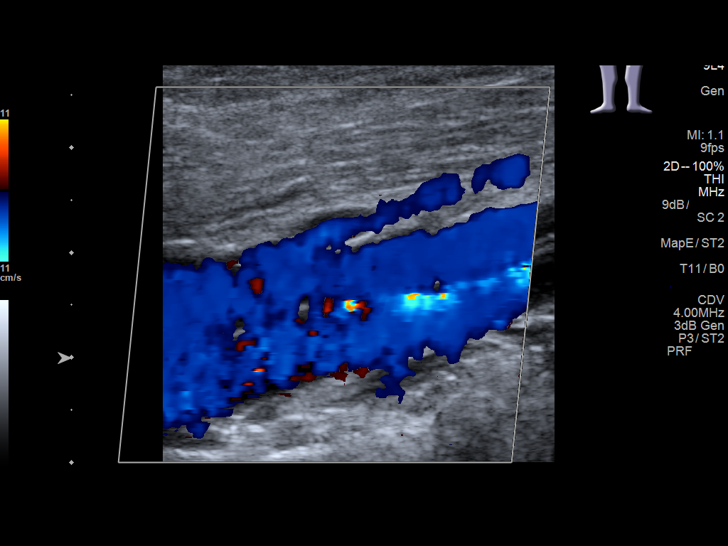
[im 32/46]
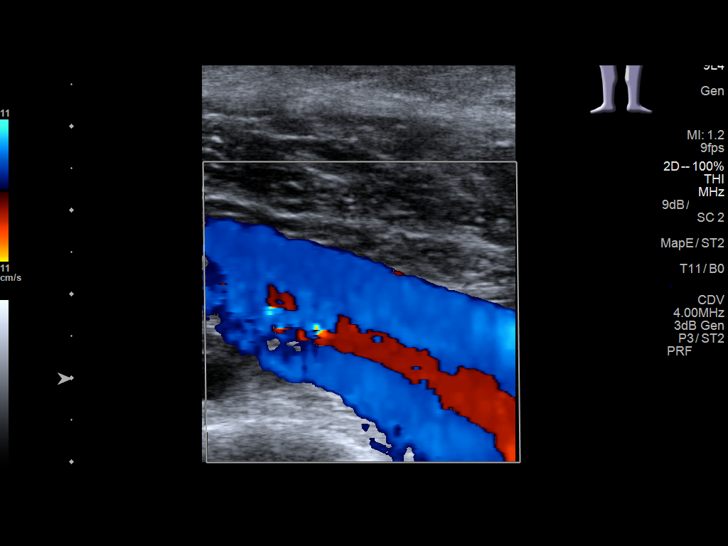
[im 36/46]
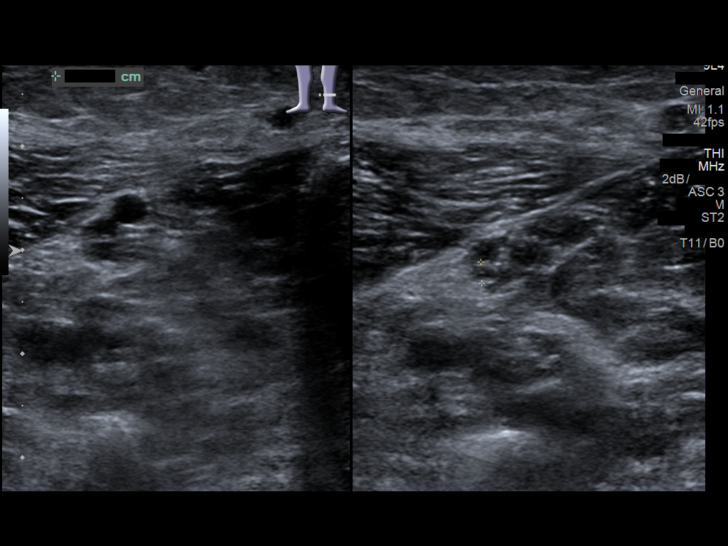
[im 38/46]
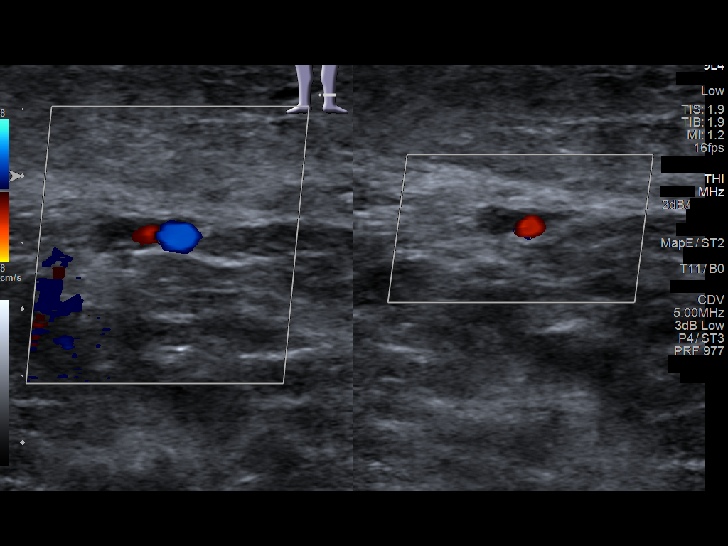
[im 42/46]
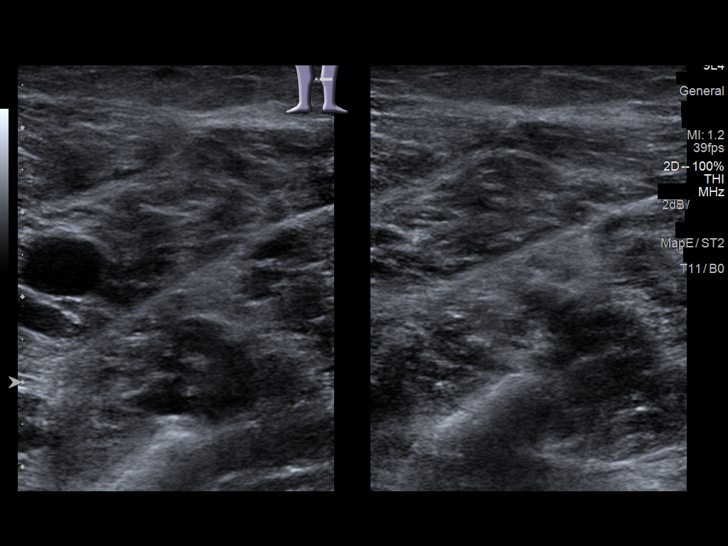
[im 46/46]
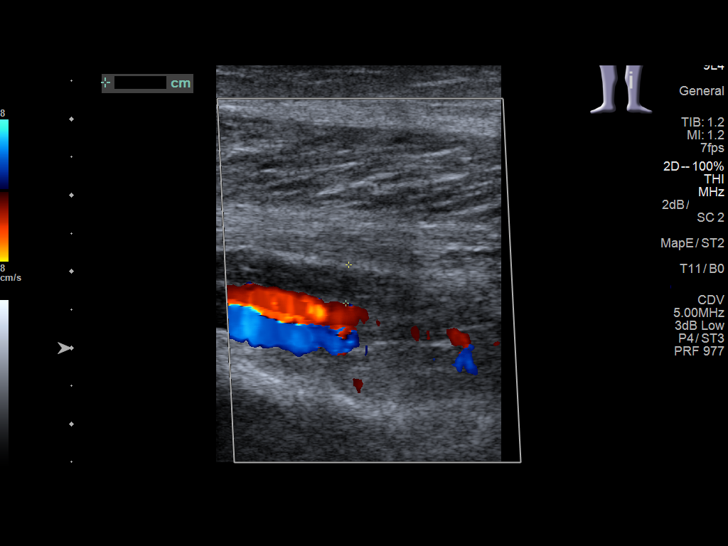

[14 of 24 positions shown; findings below may reference images not displayed]

FINDINGS: Normal compressibility of the common femoral, superficial femoral,
and popliteal veins. There is occlusion and resultant
noncompressibility of posterior tibial and peroneal veins in the
calf.

Visualized segments of the saphenous venous system normal in caliber
and compressibility. Survey views of the contralateral common
femoral vein are unremarkable.
IMPRESSION: 1. Isolated calf DVT (peroneal and posterior tibial veins) without
central propagation.

## 2017-11-30 ENCOUNTER — Ambulatory Visit: Payer: BLUE CROSS/BLUE SHIELD | Admitting: Sports Medicine

## 2017-11-30 ENCOUNTER — Ambulatory Visit (INDEPENDENT_AMBULATORY_CARE_PROVIDER_SITE_OTHER): Payer: BLUE CROSS/BLUE SHIELD

## 2017-11-30 ENCOUNTER — Encounter: Payer: Self-pay | Admitting: Sports Medicine

## 2017-11-30 VITALS — BP 118/71 | HR 63 | Temp 98.0°F | Resp 15 | Ht 71.0 in | Wt 195.0 lb

## 2017-11-30 DIAGNOSIS — M722 Plantar fascial fibromatosis: Secondary | ICD-10-CM

## 2017-11-30 DIAGNOSIS — M79672 Pain in left foot: Secondary | ICD-10-CM | POA: Diagnosis not present

## 2017-11-30 MED ORDER — TRIAMCINOLONE ACETONIDE 10 MG/ML IJ SUSP: 10.0000 mg | Freq: Once | INTRAMUSCULAR | Status: DC

## 2017-11-30 MED ORDER — MELOXICAM 15 MG PO TABS
15.0000 mg | ORAL_TABLET | Freq: Every day | ORAL | 0 refills | Status: DC
Start: 2017-11-30 — End: 2017-12-27

## 2017-11-30 NOTE — Patient Instructions (Signed)

## 2017-11-30 NOTE — Progress Notes (Signed)
   Subjective:    Patient ID: Christopher Gray, male    DOB: 07-10-1960, 57 y.o.   MRN: [id number]  HPI    Review of Systems  Musculoskeletal: Positive for arthralgias and myalgias.  All other systems reviewed and are negative.      Objective:   Physical Exam        Assessment & Plan:

## 2017-11-30 NOTE — Progress Notes (Signed)
Subjective: Christopher Gray is a 57 y.o. male patient presents to office with complaint of moderate heel pain on the left. Patient admits to post static dyskinesia for 1 month in duration reports that pain is sharp 8 out of 10 especially with first few steps out of bed in the morning states that he does not recall any type of injury but does competitive dancing and sometimes even after he has finished dancing the heel is aching and throbbing in excruciating and pain. Patient has treated this problem with stretching icing over-the-counter inserts and topical pain cream and rub with no improvement in symptoms. Denies any other pedal complaints.   Review of Systems  Musculoskeletal: Positive for joint pain and myalgias.  All other systems reviewed and are negative.    Patient Active Problem List   Diagnosis Date Noted  . DVT (deep venous thrombosis) ([place]) 11/04/2016  . Advance care planning 12/02/2014  . Routine general medical examination at a health care facility 07/12/2011    Current Outpatient Medications on File Prior to Visit  Medication Sig Dispense Refill  . Multiple Vitamin (MULTIVITAMIN) tablet Take 1 tablet by mouth daily.       No current facility-administered medications on file prior to visit.     No Known Allergies  Objective: Physical Exam General: The patient is alert and oriented x3 in no acute distress.  Dermatology: Skin is warm, dry and supple bilateral lower extremities. Nails 1-10 are normal. There is no erythema, edema, no eccymosis, no open lesions present. Integument is otherwise unremarkable.  Vascular: Dorsalis Pedis pulse and Posterior Tibial pulse are 2/4 bilateral. Capillary fill time is immediate to all digits.  Neurological: Grossly intact to light touch with an achilles reflex of +2/5 and a  negative Tinel's sign bilateral.  Musculoskeletal: Tenderness to palpation at the medial calcaneal tubercale and through the insertion of the plantar fascia on  the left foot. No pain with compression of calcaneus bilateral. No pain with tuning fork to calcaneus bilateral. No pain with calf compression bilateral. There is decreased Ankle joint range of motion bilateral. All other joints range of motion within normal limits bilateral. Strength 5/5 in all groups bilateral.   Gait: Unassisted, Antalgic avoid weight on left heel Xray,Left foot:  Normal osseous mineralization. Joint spaces preserved. No fracture/dislocation/boney destruction. Calcaneal spur present with mild thickening of plantar fascia. No other soft tissue abnormalities or radiopaque foreign bodies.   Assessment and Plan: Problem List Items Addressed This Visit    None    Visit Diagnoses    Plantar fasciitis    -  Primary   Relevant Medications   meloxicam (MOBIC) 15 MG tablet   triamcinolone acetonide (KENALOG) 10 MG/ML injection 10 mg (Start on 11/30/2017  2:00 PM)   Left foot pain       Relevant Medications   meloxicam (MOBIC) 15 MG tablet   triamcinolone acetonide (KENALOG) 10 MG/ML injection 10 mg (Start on 11/30/2017  2:00 PM)   Other Relevant Orders   DG Foot Complete Right      -Complete examination performed.  -Xrays reviewed -Discussed with patient in detail the condition of plantar fasciitis, how this occurs and general treatment options. Explained both conservative and surgical treatments.  -After oral consent and aseptic prep, injected a mixture containing 1 ml of 2%  plain lidocaine, 1 ml 0.5% plain marcaine, 0.5 ml of kenalog 10 and 0.5 ml of dexamethasone phosphate into left heel post-injection care discussed with patient.  -Rx Meloxicam -Recommended  good supportive shoes and advised use of OTC insert. Explained to patient that if these orthoses work well, we will continue with these. If these do not improve his condition and  pain, we will consider custom molded orthoses. -Explained and dispensed to patient daily stretching exercises. -Recommend patient to ice  affected area 1-2x daily. -Patient to return to office as needed for follow up or sooner if problems or questions arise.  Landis Martins, DPM

## 2017-12-02 ENCOUNTER — Other Ambulatory Visit: Payer: Self-pay | Admitting: Sports Medicine

## 2017-12-02 DIAGNOSIS — M722 Plantar fascial fibromatosis: Secondary | ICD-10-CM

## 2017-12-02 DIAGNOSIS — M79672 Pain in left foot: Secondary | ICD-10-CM

## 2017-12-09 ENCOUNTER — Other Ambulatory Visit: Payer: Self-pay | Admitting: Family Medicine

## 2017-12-09 DIAGNOSIS — I82409 Acute embolism and thrombosis of unspecified deep veins of unspecified lower extremity: Secondary | ICD-10-CM

## 2017-12-10 ENCOUNTER — Other Ambulatory Visit: Payer: BLUE CROSS/BLUE SHIELD

## 2017-12-12 ENCOUNTER — Other Ambulatory Visit (INDEPENDENT_AMBULATORY_CARE_PROVIDER_SITE_OTHER): Payer: BLUE CROSS/BLUE SHIELD

## 2017-12-12 DIAGNOSIS — I82409 Acute embolism and thrombosis of unspecified deep veins of unspecified lower extremity: Secondary | ICD-10-CM

## 2017-12-12 LAB — CBC WITH DIFFERENTIAL/PLATELET
BASOS PCT: 0.6 % (ref 0.0–3.0)
Basophils Absolute: 0 10*3/uL (ref 0.0–0.1)
EOS PCT: 3.8 % (ref 0.0–5.0)
Eosinophils Absolute: 0.2 10*3/uL (ref 0.0–0.7)
HCT: 45.5 % (ref 39.0–52.0)
Hemoglobin: 15.6 g/dL (ref 13.0–17.0)
LYMPHS ABS: 1.3 10*3/uL (ref 0.7–4.0)
Lymphocytes Relative: 26.9 % (ref 12.0–46.0)
MCHC: 34.3 g/dL (ref 30.0–36.0)
MCV: 92 fl (ref 78.0–100.0)
MONO ABS: 0.4 10*3/uL (ref 0.1–1.0)
Monocytes Relative: 7.9 % (ref 3.0–12.0)
NEUTROS PCT: 60.8 % (ref 43.0–77.0)
Neutro Abs: 3 10*3/uL (ref 1.4–7.7)
PLATELETS: 227 10*3/uL (ref 150.0–400.0)
RBC: 4.95 Mil/uL (ref 4.22–5.81)
RDW: 12.9 % (ref 11.5–15.5)
WBC: 4.9 10*3/uL (ref 4.0–10.5)

## 2017-12-12 LAB — COMPREHENSIVE METABOLIC PANEL
ALT: 20 U/L (ref 0–53)
AST: 22 U/L (ref 0–37)
Albumin: 4.1 g/dL (ref 3.5–5.2)
Alkaline Phosphatase: 56 U/L (ref 39–117)
BUN: 26 mg/dL — AB (ref 6–23)
CO2: 30 meq/L (ref 19–32)
Calcium: 9.3 mg/dL (ref 8.4–10.5)
Chloride: 105 mEq/L (ref 96–112)
Creatinine, Ser: 1.42 mg/dL (ref 0.40–1.50)
GFR: 54.94 mL/min — ABNORMAL LOW (ref >60.00)
GLUCOSE: 98 mg/dL (ref 70–99)
POTASSIUM: 3.9 meq/L (ref 3.5–5.1)
SODIUM: 141 meq/L (ref 135–145)
Total Bilirubin: 1.2 mg/dL (ref 0.2–1.2)
Total Protein: 6.3 g/dL (ref 6.0–8.3)

## 2017-12-17 ENCOUNTER — Encounter: Payer: Self-pay | Admitting: Family Medicine

## 2017-12-17 ENCOUNTER — Ambulatory Visit (INDEPENDENT_AMBULATORY_CARE_PROVIDER_SITE_OTHER): Payer: BLUE CROSS/BLUE SHIELD | Admitting: Family Medicine

## 2017-12-17 VITALS — BP 112/68 | HR 63 | Temp 98.4°F | Ht 71.0 in | Wt 197.8 lb

## 2017-12-17 DIAGNOSIS — Z0000 Encounter for general adult medical examination without abnormal findings: Secondary | ICD-10-CM

## 2017-12-17 DIAGNOSIS — Z86718 Personal history of other venous thrombosis and embolism: Secondary | ICD-10-CM

## 2017-12-17 DIAGNOSIS — Z125 Encounter for screening for malignant neoplasm of prostate: Secondary | ICD-10-CM

## 2017-12-17 DIAGNOSIS — Z7189 Other specified counseling: Secondary | ICD-10-CM

## 2017-12-17 MED ORDER — VALACYCLOVIR HCL 1 G PO TABS: 2000.0000 mg | ORAL_TABLET | Freq: Two times a day (BID) | ORAL | 0 refills | Status: DC

## 2017-12-17 NOTE — Progress Notes (Signed)
CPE- See plan.  Routine anticipatory guidance given to patient.  See health maintenance.  The possibility exists that previously documented standard health maintenance information may have been brought forward from a previous encounter into this note.  If needed, that same information has been updated to reflect the current situation based on today's encounter.    Tetanus 2014 Flu shot encouraged.  PNA and shingles not due.  Colonoscopy 03/2013.   PSA prev wnl.  D/w pt about pros and cons of rechecking PSA.  Consider repeat PSA next year. Living will d/w pt. He wants his neighbor Pamala Hurry designated if patient were incapacitated.   Diet and exercise d/w pt. Doing well on both. More weights than cardio.  Labs d/w pt.  HIV and HCV screening prev neg.   He had 1 episode of of DVT s/p treatment.  On ASA.  No ADE on med.  No new sx.    He had his foot injected but w/o relief.  He is going to f/u with podiatry as needed.  I'll defer.  Routine nsaid cautions d/w pt.    PMH and SH reviewed  Meds, vitals, and allergies reviewed.   ROS: Per HPI.  Unless specifically indicated otherwise in HPI, the patient denies:  General: fever. Eyes: acute vision changes ENT: sore throat Cardiovascular: chest pain Respiratory: SOB GI: vomiting GU: dysuria Musculoskeletal: acute back pain Derm: acute rash Neuro: acute motor dysfunction Psych: worsening mood Endocrine: polydipsia Heme: bleeding Allergy: hayfever  GEN: nad, alert and oriented HEENT: mucous membranes moist, cold sore on R side of mouth.   NECK: supple w/o LA CV: rrr. PULM: ctab, no inc wob ABD: soft, +bs EXT: no edema SKIN: no acute rash

## 2017-12-17 NOTE — Patient Instructions (Addendum)
The GI clinic will likely call you about a follow up colonoscopy at the end of 2019 or early in 2020.  Check with the insurance about the cost on the follow up testing.   I would get a flu shot each fall.   Keep drinking plenty of water and take meloxicam with food.  Follow up with the foot clinic.  Phone: [phone] Take care.  Glad to see you.

## 2017-12-18 ENCOUNTER — Telehealth: Payer: Self-pay | Admitting: Sports Medicine

## 2017-12-18 NOTE — Telephone Encounter (Addendum)
Pt states had injection 2 weeks ago, and taking the medication, but is not better should he come in for another injection. I told pt to make an appt and be consistent with ice therapy 3-4 times day for 15-20 minutes/sessions protecting the skin. I told pt I would have schedulers call tomorrow.

## 2017-12-18 NOTE — Assessment & Plan Note (Signed)
Living will d/w pt. He wants his neighbor Pamala Hurry designated if patient were incapacitated.

## 2017-12-18 NOTE — Telephone Encounter (Signed)
Pt had a injection done and had some questions. Please call after 5 if possible.

## 2017-12-18 NOTE — Assessment & Plan Note (Signed)
Off anticoagulation.  Continue aspirin.  No symptoms.  Update me as needed.  He agrees.

## 2017-12-18 NOTE — Assessment & Plan Note (Addendum)
Tetanus 2014 Flu shot encouraged.  PNA and shingles not due.  Colonoscopy 03/2013.   PSA prev wnl. D/w pt about pros and cons of rechecking PSA.  Consider repeat PSA next year. Living will d/w pt. He wants his neighbor Pamala Hurry designated if patient were incapacitated.   Diet and exercise d/w pt. Doing well on both. More weights than cardio.  Labs d/w pt.  HIV and HCV screening prev neg.

## 2017-12-20 ENCOUNTER — Telehealth: Payer: Self-pay | Admitting: Sports Medicine

## 2017-12-20 NOTE — Telephone Encounter (Signed)
Left vm for pt to call to schedule follow up appointment.

## 2017-12-27 ENCOUNTER — Other Ambulatory Visit: Payer: Self-pay | Admitting: Sports Medicine

## 2017-12-27 DIAGNOSIS — M722 Plantar fascial fibromatosis: Secondary | ICD-10-CM

## 2017-12-27 DIAGNOSIS — M79672 Pain in left foot: Secondary | ICD-10-CM

## 2018-01-18 ENCOUNTER — Encounter: Payer: Self-pay | Admitting: Sports Medicine

## 2018-01-18 ENCOUNTER — Ambulatory Visit: Payer: BLUE CROSS/BLUE SHIELD | Admitting: Sports Medicine

## 2018-01-18 DIAGNOSIS — M722 Plantar fascial fibromatosis: Secondary | ICD-10-CM

## 2018-01-18 DIAGNOSIS — M79672 Pain in left foot: Secondary | ICD-10-CM

## 2018-01-18 MED ORDER — TRIAMCINOLONE ACETONIDE 40 MG/ML IJ SUSP: 20.0000 mg | Freq: Once | INTRAMUSCULAR | Status: DC

## 2018-01-18 NOTE — Patient Instructions (Signed)
For tennis shoes recommend:  Brooks Beast Ascis New balance Saucony Can be purchased at [employer] or [employer]  Vionic  SAS Can be purchased at [employer] or [employer]   For work shoes recommend: Sketchers Work Timberland boots  Can be purchased at a variety of places or [employer]   For casual shoes recommend: Vionic  Can be purchased at [employer] or [employer]  

## 2018-01-18 NOTE — Progress Notes (Signed)
Subjective: Christopher Gray is a 57 y.o. male returns to office for follow up evaluation after Left heel injection for plantar fasciitis, injection #1 administered 6 weeks ago. Patient states that the injection seems to help at first but now the pain is back in a different area on the outer side of the heel states that it feels like a pinch when he is walking pain can sometimes intensify 7 out of 10 patient also does competitive dance and reports that even with out dancing he still has pain states that he has been using his insoles icing stretching and finished off his meloxicam medicine but pain is still present.  Patient denies nausea, vomiting, fever, chills or any other constitutional symptoms at this time. Patient denies any recent changes in medications or new problems since last visit.  Reports at his last primary care visit his PCP said that he did not want him to have a another refill of the meloxicam however patient is unsure why.  Patient Active Problem List   Diagnosis Date Noted  . History of DVT in adulthood 11/04/2016  . Advance care planning 12/02/2014  . Routine general medical examination at a health care facility 07/12/2011    Current Outpatient Medications on File Prior to Visit  Medication Sig Dispense Refill  . aspirin 325 MG tablet Take 325 mg by mouth daily.    . meloxicam (MOBIC) 15 MG tablet TAKE 1 TABLET BY MOUTH EVERY DAY 30 tablet 0  . Multiple Vitamin (MULTIVITAMIN) tablet Take 1 tablet by mouth daily.      . valACYclovir (VALTREX) 1000 MG tablet Take 2 tablets (2,000 mg total) by mouth 2 (two) times daily. For 1 day. 20 tablet 0   Current Facility-Administered Medications on File Prior to Visit  Medication Dose Route Frequency Provider Last Rate Last Dose  . triamcinolone acetonide (KENALOG) 10 MG/ML injection 10 mg  10 mg Other Once Landis Martins, DPM        No Known Allergies  Objective:   General:  Alert and oriented x 3, in no acute  distress  Dermatology: Skin is warm, dry, and supple bilateral. Nails are within normal limits. There is no lower extremity erythema, no eccymosis, minimal reactive callus plantar aspect of fifth metatarsal head on left, no open lesions present bilateral.   Vascular: Dorsalis Pedis and Posterior Tibial pedal pulses are 2/4 bilateral. + hair growth noted bilateral. Capillary Fill Time is 3 seconds in all digits. No varicosities, No edema bilateral lower extremities.   Neurological: Sensation grossly intact to light touch with an achilles reflex of +2 and a  negative Tinel's sign bilateral. Vibratory, sharp/dull, Semmes Weinstein Monofilament within normal limits.   Musculoskeletal: There is decreased tenderness to palpation at the lateral calcaneal tubercale and through the insertion of the plantar fascia on the Left foot with resolved pain on the medial aspect of the plantar fascia. No pain with compression to calcaneus or application of tuning fork. There is decreased Ankle joint range of motion bilateral. All other jointsrange of motion  within normal limits bilateral. Strength 5/5 bilateral.   Assessment and Plan: Problem List Items Addressed This Visit    None    Visit Diagnoses    Plantar fasciitis    -  Primary   lateral band   Relevant Medications   triamcinolone acetonide (KENALOG-40) injection 20 mg   Left foot pain       Relevant Medications   triamcinolone acetonide (KENALOG-40) injection 20 mg      -  Complete examination performed.  -Previous x-rays reviewed. -Discussed with patient in detail the condition of plantar fasciitis, how this  occurs related to the foot type of the patient and general treatment options. - Patient opted for another injection today; After oral consent and aseptic prep, injected a mixture containing 1 ml of 1%plain lidocaine, 1 ml 0.5% plain marcaine, 0.5 ml of kenalog 10 and 0.5 ml of dexmethasone phosphate to left lateral heel at area of most  pain/trigger point injection. -Dispensed plantar fascial brace to use as instructed -Continue with stretching, icing, good supportive shoes, inserts daily.  Shoe recommendation list given. -Discussed long term care and reocurrence; will closely monitor; if fails to improve will consider other treatment modalities.  -Patient to return to office as needed or sooner if problems or questions arise.  Advised patient may benefit in the future from custom molded insoles to help control oversupination.  Landis Martins, DPM

## 2018-03-12 ENCOUNTER — Encounter: Payer: Self-pay | Admitting: Family Medicine

## 2018-04-01 ENCOUNTER — Encounter: Payer: Self-pay | Admitting: Family Medicine

## 2018-06-17 ENCOUNTER — Encounter: Payer: Self-pay | Admitting: Internal Medicine

## 2018-07-15 ENCOUNTER — Encounter: Payer: Self-pay | Admitting: Family Medicine

## 2018-07-24 ENCOUNTER — Encounter: Payer: Self-pay | Admitting: Family Medicine

## 2019-04-26 ENCOUNTER — Other Ambulatory Visit: Payer: Self-pay | Admitting: Family Medicine

## 2019-04-26 DIAGNOSIS — Z125 Encounter for screening for malignant neoplasm of prostate: Secondary | ICD-10-CM

## 2019-04-26 DIAGNOSIS — Z8639 Personal history of other endocrine, nutritional and metabolic disease: Secondary | ICD-10-CM

## 2019-04-26 DIAGNOSIS — Z86718 Personal history of other venous thrombosis and embolism: Secondary | ICD-10-CM

## 2019-05-04 ENCOUNTER — Other Ambulatory Visit (INDEPENDENT_AMBULATORY_CARE_PROVIDER_SITE_OTHER): Payer: 59

## 2019-05-04 ENCOUNTER — Other Ambulatory Visit: Payer: Self-pay

## 2019-05-04 DIAGNOSIS — Z86718 Personal history of other venous thrombosis and embolism: Secondary | ICD-10-CM | POA: Diagnosis not present

## 2019-05-04 DIAGNOSIS — Z8639 Personal history of other endocrine, nutritional and metabolic disease: Secondary | ICD-10-CM | POA: Diagnosis not present

## 2019-05-04 DIAGNOSIS — Z125 Encounter for screening for malignant neoplasm of prostate: Secondary | ICD-10-CM

## 2019-05-04 LAB — CBC WITH DIFFERENTIAL/PLATELET
Basophils Absolute: 0 10*3/uL (ref 0.0–0.1)
Basophils Relative: 0.5 % (ref 0.0–3.0)
Eosinophils Absolute: 0.2 10*3/uL (ref 0.0–0.7)
Eosinophils Relative: 3.6 % (ref 0.0–5.0)
HCT: 48.4 % (ref 39.0–52.0)
Hemoglobin: 16 g/dL (ref 13.0–17.0)
Lymphocytes Relative: 24.4 % (ref 12.0–46.0)
Lymphs Abs: 1.3 10*3/uL (ref 0.7–4.0)
MCHC: 33.2 g/dL (ref 30.0–36.0)
MCV: 92.8 fl (ref 78.0–100.0)
Monocytes Absolute: 0.4 10*3/uL (ref 0.1–1.0)
Monocytes Relative: 7.2 % (ref 3.0–12.0)
Neutro Abs: 3.6 10*3/uL (ref 1.4–7.7)
Neutrophils Relative %: 64.3 % (ref 43.0–77.0)
Platelets: 213 10*3/uL (ref 150.0–400.0)
RBC: 5.21 Mil/uL (ref 4.22–5.81)
RDW: 12.7 % (ref 11.5–15.5)
WBC: 5.5 10*3/uL (ref 4.0–10.5)

## 2019-05-04 LAB — COMPREHENSIVE METABOLIC PANEL
ALT: 20 U/L (ref 0–53)
AST: 19 U/L (ref 0–37)
Albumin: 4.1 g/dL (ref 3.5–5.2)
Alkaline Phosphatase: 79 U/L (ref 39–117)
BUN: 21 mg/dL (ref 6–23)
CO2: 28 mEq/L (ref 19–32)
Calcium: 9.2 mg/dL (ref 8.4–10.5)
Chloride: 104 mEq/L (ref 96–112)
Creatinine, Ser: 1.47 mg/dL (ref 0.40–1.50)
GFR: 49.42 mL/min — ABNORMAL LOW (ref >60.00)
Glucose, Bld: 98 mg/dL (ref 70–99)
Potassium: 4 mEq/L (ref 3.5–5.1)
Sodium: 140 mEq/L (ref 135–145)
Total Bilirubin: 0.7 mg/dL (ref 0.2–1.2)
Total Protein: 6.1 g/dL (ref 6.0–8.3)

## 2019-05-04 LAB — LIPID PANEL
Cholesterol: 166 mg/dL (ref 0–200)
HDL: 41.1 mg/dL (ref >39.00)
LDL Cholesterol: 109 mg/dL — ABNORMAL HIGH (ref 0–99)
NonHDL: 124.93
Total CHOL/HDL Ratio: 4
Triglycerides: 78 mg/dL (ref 0.0–149.0)
VLDL: 15.6 mg/dL (ref 0.0–40.0)

## 2019-05-04 LAB — PSA: PSA: 0.4 ng/mL (ref 0.10–4.00)

## 2019-05-07 ENCOUNTER — Ambulatory Visit (INDEPENDENT_AMBULATORY_CARE_PROVIDER_SITE_OTHER): Payer: 59 | Admitting: Family Medicine

## 2019-05-07 ENCOUNTER — Encounter: Payer: Self-pay | Admitting: Family Medicine

## 2019-05-07 ENCOUNTER — Other Ambulatory Visit: Payer: Self-pay

## 2019-05-07 VITALS — BP 130/72 | HR 81 | Temp 97.2°F | Ht 71.0 in | Wt 203.1 lb

## 2019-05-07 DIAGNOSIS — Z1211 Encounter for screening for malignant neoplasm of colon: Secondary | ICD-10-CM

## 2019-05-07 DIAGNOSIS — Z8619 Personal history of other infectious and parasitic diseases: Secondary | ICD-10-CM

## 2019-05-07 DIAGNOSIS — Z0000 Encounter for general adult medical examination without abnormal findings: Secondary | ICD-10-CM

## 2019-05-07 DIAGNOSIS — Z7189 Other specified counseling: Secondary | ICD-10-CM

## 2019-05-07 NOTE — Progress Notes (Signed)
This visit occurred during the SARS-CoV-2 public health emergency.  Safety protocols were in place, including screening questions prior to the visit, additional usage of staff PPE, and extensive cleaning of exam room while observing appropriate contact time as indicated for disinfecting solutions.  CPE- See plan.  Routine anticipatory guidance given to patient.  See health maintenance.  The possibility exists that previously documented standard health maintenance information may have been brought forward from a previous encounter into this note.  If needed, that same information has been updated to reflect the current situation based on today's encounter.    Tetanus 2014 Flu shot encouraged. To be done at pharmacy. PNA and shingles not due.  Colonoscopy 03/2013.  PSA wnl 2021.   Diet and exercise d/w pt.  Labs d/w pt.  HIV and HCV screening prev neg. Living will d/w pt.He wants his neighbor Roney Jaffe designated if patient were incapacitated.    H/o cold sores, rarely.  He has used abreva with some relief.  He occ has sx in the winter.    PMH and SH reviewed  Meds, vitals, and allergies reviewed.   ROS: Per HPI.  Unless specifically indicated otherwise in HPI, the patient denies:  General: fever. Eyes: acute vision changes ENT: sore throat Cardiovascular: chest pain Respiratory: SOB GI: vomiting GU: dysuria Musculoskeletal: acute back pain Derm: acute rash Neuro: acute motor dysfunction Psych: worsening mood Endocrine: polydipsia Heme: bleeding Allergy: hayfever  GEN: nad, alert and oriented HEENT: ncat NECK: supple w/o LA CV: rrr. PULM: ctab, no inc wob ABD: soft, +bs EXT: no edema SKIN: no acute rash

## 2019-05-07 NOTE — Patient Instructions (Addendum)
We'll call about a GI appointment.  Take care.  Glad to see you.  See about coverage on the flu shot.  It may be cheaper at the pharmacy.   Your labs are fine for now.

## 2019-05-10 DIAGNOSIS — Z8619 Personal history of other infectious and parasitic diseases: Secondary | ICD-10-CM | POA: Insufficient documentation

## 2019-05-10 NOTE — Assessment & Plan Note (Signed)
  H/o cold sores, rarely.  He has used abreva with some relief.  He occ has sx in the winter.  Would continue as needed treatment with Abreva but if he notes frequent symptoms he can let me know and we can consider daily prophylactic treatment with Valtrex.

## 2019-05-10 NOTE — Assessment & Plan Note (Signed)
Tetanus 2014 Flu shot encouraged. To be done at pharmacy. PNA and shingles not due.  Colonoscopy 03/2013.  PSA wnl 2021.   Diet and exercise d/w pt.  Labs d/w pt.  HIV and HCV screening prev neg. Living will d/w pt.He wants his neighbor Roney Jaffe designated if patient were incapacitated.

## 2019-05-10 NOTE — Assessment & Plan Note (Signed)
Living will d/w pt.He wants his neighbor Roney Jaffe designated if patient were incapacitated.

## 2019-05-21 ENCOUNTER — Encounter: Payer: Self-pay | Admitting: Family Medicine

## 2019-06-11 ENCOUNTER — Encounter: Payer: Self-pay | Admitting: Internal Medicine

## 2019-07-07 ENCOUNTER — Other Ambulatory Visit: Payer: Self-pay

## 2019-07-07 ENCOUNTER — Ambulatory Visit (AMBULATORY_SURGERY_CENTER): Payer: Self-pay | Admitting: *Deleted

## 2019-07-07 VITALS — Temp 96.9°F | Ht 71.0 in | Wt 203.0 lb

## 2019-07-07 DIAGNOSIS — Z86010 Personal history of colonic polyps: Secondary | ICD-10-CM

## 2019-07-07 MED ORDER — SUPREP BOWEL PREP KIT 17.5-3.13-1.6 GM/177ML PO SOLN: 1.0000 | Freq: Once | ORAL | 0 refills | Status: AC

## 2019-07-07 NOTE — Progress Notes (Signed)
covid test [facility] 3-19 Friday 1030a-3pm   No egg or soy allergy known to patient  No issues with past sedation with any surgeries  or procedures, no intubation problems  No diet pills per patient No home 02 use per patient  No blood thinners per patient  Pt denies issues with constipation  No A fib or A flutter  EMMI video sent to pt's e mail   Due to the COVID-19 pandemic we are asking patients to follow these guidelines. Please only bring one care partner. Please be aware that your care partner may wait in the car in the parking lot or if they feel like they will be too hot to wait in the car, they may wait in the lobby on the 4th floor. All care partners are required to wear a mask the entire time (we do not have any that we can provide them), they need to practice social distancing, and we will do a Covid check for all patient's and care partners when you arrive. Also we will check their temperature and your temperature. If the care partner waits in their car they need to stay in the parking lot the entire time and we will call them on their cell phone when the patient is ready for discharge so they can bring the car to the front of the building. Also all patient's will need to wear a mask into building.

## 2019-07-09 ENCOUNTER — Encounter: Payer: Self-pay | Admitting: Family Medicine

## 2019-07-17 ENCOUNTER — Other Ambulatory Visit: Payer: Self-pay

## 2019-07-17 ENCOUNTER — Other Ambulatory Visit
Admission: RE | Admit: 2019-07-17 | Discharge: 2019-07-17 | Disposition: A | Discharge status: Home / Self Care | Payer: 59 | Source: Ambulatory Visit | Attending: Internal Medicine | Admitting: Internal Medicine

## 2019-07-17 DIAGNOSIS — Z01812 Encounter for preprocedural laboratory examination: Secondary | ICD-10-CM | POA: Diagnosis present

## 2019-07-17 DIAGNOSIS — Z20822 Contact with and (suspected) exposure to covid-19: Secondary | ICD-10-CM | POA: Diagnosis not present

## 2019-07-18 LAB — SARS CORONAVIRUS 2 (TAT 6-24 HRS): SARS Coronavirus 2: NEGATIVE

## 2019-07-21 ENCOUNTER — Telehealth: Payer: Self-pay | Admitting: Gastroenterology

## 2019-07-21 ENCOUNTER — Encounter: Payer: Self-pay | Admitting: Internal Medicine

## 2019-07-21 ENCOUNTER — Other Ambulatory Visit: Payer: Self-pay

## 2019-07-21 ENCOUNTER — Ambulatory Visit (AMBULATORY_SURGERY_CENTER): Payer: 59 | Admitting: Internal Medicine

## 2019-07-21 VITALS — BP 120/70 | HR 57 | Temp 97.1°F | Resp 9 | Ht 71.0 in | Wt 203.0 lb

## 2019-07-21 DIAGNOSIS — K635 Polyp of colon: Secondary | ICD-10-CM | POA: Diagnosis not present

## 2019-07-21 DIAGNOSIS — D124 Benign neoplasm of descending colon: Secondary | ICD-10-CM

## 2019-07-21 DIAGNOSIS — Z86010 Personal history of colonic polyps: Secondary | ICD-10-CM

## 2019-07-21 MED ORDER — SODIUM CHLORIDE 0.9 % IV SOLN: 500.0000 mL | Freq: Once | INTRAVENOUS | Status: DC

## 2019-07-21 NOTE — Patient Instructions (Signed)
Diverticulosis and 1 polyp found, removed and sent to pathology.  Hand outs on all findings given to patient.    YOU HAD AN ENDOSCOPIC PROCEDURE TODAY AT [facility]:   Refer to the procedure report that was given to you for any specific questions about what was found during the examination.  If the procedure report does not answer your questions, please call your gastroenterologist to clarify.  If you requested that your care partner not be given the details of your procedure findings, then the procedure report has been included in a sealed envelope for you to review at your convenience later.  YOU SHOULD EXPECT: Some feelings of bloating in the abdomen. Passage of more gas than usual.  Walking can help get rid of the air that was put into your GI tract during the procedure and reduce the bloating. If you had a lower endoscopy (such as a colonoscopy or flexible sigmoidoscopy) you may notice spotting of blood in your stool or on the toilet paper. If you underwent a bowel prep for your procedure, you may not have a normal bowel movement for a few days.  Please Note:  You might notice some irritation and congestion in your nose or some drainage.  This is from the oxygen used during your procedure.  There is no need for concern and it should clear up in a day or so.  SYMPTOMS TO REPORT IMMEDIATELY:   Following lower endoscopy (colonoscopy or flexible sigmoidoscopy):  Excessive amounts of blood in the stool  Significant tenderness or worsening of abdominal pains  Swelling of the abdomen that is new, acute  Fever of 100F or higher   For urgent or emergent issues, a gastroenterologist can be reached at any hour by calling [phone]. Do not use MyChart messaging for urgent concerns.    DIET:  We do recommend a small meal at first, but then you may proceed to your regular diet.  Drink plenty of fluids but you should avoid alcoholic beverages for 24 hours.  ACTIVITY:  You should  plan to take it easy for the rest of today and you should NOT DRIVE or use heavy machinery until tomorrow (because of the sedation medicines used during the test).    FOLLOW UP: Our staff will call the number listed on your records 48-72 hours following your procedure to check on you and address any questions or concerns that you may have regarding the information given to you following your procedure. If we do not reach you, we will leave a message.  We will attempt to reach you two times.  During this call, we will ask if you have developed any symptoms of COVID 19. If you develop any symptoms (ie: fever, flu-like symptoms, shortness of breath, cough etc.) before then, please call [phone].  If you test positive for Covid 19 in the 2 weeks post procedure, please call and report this information to [place].    If any biopsies were taken you will be contacted by phone or by letter within the next 1-3 weeks.  Please call us at [phone] if you have not heard about the biopsies in 3 weeks.    SIGNATURES/CONFIDENTIALITY: You and/or your care partner have signed paperwork which will be entered into your electronic medical record.  These signatures attest to the fact that that the information above on your After Visit Summary has been reviewed and is understood.  Full responsibility of the confidentiality of this discharge information lies with  you and/or your care-partner.

## 2019-07-21 NOTE — Op Note (Signed)
[name] Procedure Date: 07/21/2019 9:31 AM MRN: [id number] Endoscopist: Jerene Bears , MD Age: 59 Referring MD:  Date of Birth: [birth year] Gender: Male Account #: [id number] Procedure:                Colonoscopy Indications:              High risk colon cancer surveillance: Personal                            history of non-advanced adenoma, Last colonoscopy:                            December 2014 Medicines:                Monitored Anesthesia Care Procedure:                Pre-Anesthesia Assessment:                           - Prior to the procedure, a History and Physical                            was performed, and patient medications and                            allergies were reviewed. The patient's tolerance of                            previous anesthesia was also reviewed. The risks                            and benefits of the procedure and the sedation                            options and risks were discussed with the patient.                            All questions were answered, and informed consent                            was obtained. Prior Anticoagulants: The patient has                            taken no previous anticoagulant or antiplatelet                            agents. ASA Grade Assessment: II - A patient with                            mild systemic disease. After reviewing the risks                            and benefits, the patient was deemed in  satisfactory condition to undergo the procedure.                           After obtaining informed consent, the colonoscope                            was passed under direct vision. Throughout the                            procedure, the patient's blood pressure, pulse, and                            oxygen saturations were monitored continuously. The                            Colonoscope was introduced through the anus and                         advanced to the cecum, identified by appendiceal                            orifice and ileocecal valve. The colonoscopy was                            performed without difficulty. The patient tolerated                            the procedure well. The quality of the bowel                            preparation was good. The ileocecal valve,                            appendiceal orifice, and rectum were photographed. Scope In: 9:38:49 AM Scope Out: 9:54:39 AM Scope Withdrawal Time: 0 hours 12 minutes 18 seconds  Total Procedure Duration: 0 hours 15 minutes 50 seconds  Findings:                 The digital rectal exam was normal.                           A 4 mm polyp was found in the descending colon. The                            polyp was sessile. The polyp was removed with a                            cold snare. Resection and retrieval were complete.                           Scattered small and large-mouthed diverticula were                            found in the sigmoid colon, descending colon and  transverse colon.                           The exam was otherwise without abnormality on                            direct and retroflexion views. Complications:            No immediate complications. Estimated Blood Loss:     Estimated blood loss was minimal. Impression:               - One 4 mm polyp in the descending colon, removed                            with a cold snare. Resected and retrieved.                           - Diverticulosis in the sigmoid colon, in the                            descending colon and in the transverse colon.                           - The examination was otherwise normal on direct                            and retroflexion views. Recommendation:           - Patient has a contact number available for                            emergencies. The signs and symptoms of potential                             delayed complications were discussed with the                            patient. Return to normal activities tomorrow.                            Written discharge instructions were provided to the                            patient.                           - Resume previous diet.                           - Continue present medications.                           - Await pathology results.                           - Repeat colonoscopy is recommended for  surveillance. The colonoscopy date will be                            determined after pathology results from today's                            exam become available for review. Jerene Bears, MD 07/21/2019 9:58:12 AM This report has been signed electronically.

## 2019-07-21 NOTE — Progress Notes (Signed)
Called to room to assist during endoscopic procedure.  Patient ID and intended procedure confirmed with present staff. Received instructions for my participation in the procedure from the performing physician.  

## 2019-07-21 NOTE — Telephone Encounter (Signed)
Patient called this AM stating he was still having bowel movements after his AM preparation. His colonoscopy is scheduled for 830 this morning. Alerted him, that as long as his stools are yellow/clear he is good for procedure. He hadn't been sure whether he should still be having stools. He was appreciative for the callback.  Justice Britain, MD [facility]oscopy Office # [phone]

## 2019-07-21 NOTE — Progress Notes (Signed)
Pt's states no medical or surgical changes since previsit or office visit.  Temp taken by JB VS taken by DT

## 2019-07-21 NOTE — Progress Notes (Signed)
Report given to PACU, vss 

## 2019-07-22 ENCOUNTER — Telehealth: Payer: Self-pay | Admitting: Family Medicine

## 2019-07-22 NOTE — Telephone Encounter (Signed)
Patient called in today about a bill he received for his lab work Patient said he had a physical on 05/07/19 and received the bill for labs. He contacted the number provided on the bill statement. And they advised for him to call our office.  Patient wanted to see if something was coded wrong .  Please advise

## 2019-07-23 ENCOUNTER — Telehealth: Payer: Self-pay | Admitting: *Deleted

## 2019-07-23 NOTE — Telephone Encounter (Signed)
  Follow up Call-  Call back number 07/21/2019  Post procedure Call Back phone  # [phone]  Permission to leave phone message Yes  Some recent data might be hidden     Patient questions:  Do you have a fever, pain , or abdominal swelling? No. Pain Score  0 *  Have you tolerated food without any problems? Yes.    Have you been able to return to your normal activities? Yes.    Do you have any questions about your discharge instructions: Diet   No. Medications  No. Follow up visit  No.  Do you have questions or concerns about your Care? No.  Actions: * If pain score is 4 or above: No action needed, pain <4  1. Have you developed a fever since your procedure? NO  2.   Have you had an respiratory symptoms (SOB or cough) since your procedure? NO  3.   Have you tested positive for COVID 19 since your procedure NO  4.   Have you had any family members/close contacts diagnosed with the COVID 19 since your procedure?  NO   If yes to any of these questions please route to Joylene John, RN and Erenest Rasher, RN

## 2019-07-24 ENCOUNTER — Encounter: Payer: Self-pay | Admitting: Internal Medicine

## 2019-07-24 NOTE — Telephone Encounter (Signed)
Email sent to billing for review.  

## 2019-07-27 ENCOUNTER — Telehealth: Payer: Self-pay | Admitting: Internal Medicine

## 2019-07-27 NOTE — Telephone Encounter (Signed)
The pt has been advised of the information in the letter and also sent a copy to My [place], [place]  [address] Phone:  [phone]   Fax:  [phone]  07/24/2019 MRN: [id number]   [place] [address]  Dear Mr. Dunaway,  I am writing to inform you that the polyp removed from your colon was NOT a  pre-cancerous polyp.   Based on these results and current national guidelines, I recommend that you have a repeat colonoscopy exam in 10 years for routine colorectal screening.   Should you develop new or worsening symptoms of abdominal pain, bowel habit changes, or bleeding form the rectum or bowels, please schedule an evaluation either with me or your primary care physician.   Please call us if you have persistent problems or have questions about your condition that have not been fully answered at this time.  Sincerely,   Jerene Bears, MD

## 2019-07-28 NOTE — Telephone Encounter (Signed)
We order the medically appropriate tests.  The orders and the coding haven't changed on our end.  Coverage from insurance has gotten worse.    The insurance company will not ever pay for any labs related to a physical if it is coded as a physical.  We have to use other codes.  I used the codes that applied to him.  He has a h/o LDL >100 re: h/o HLD.  He has h/o DVT.  I have to list prostate cancer screening for the PSA check.  This is a failure of his insurance company to pay for appropriate care.

## 2019-07-28 NOTE — Telephone Encounter (Signed)
Left message for patient to call the office

## 2019-07-28 NOTE — Telephone Encounter (Signed)
Dr. Damita Dunnings.  The patient has questions as to why his labs were coded as diagnostic instead of screening. He stated he has not been diagnosed with anything.   I have already sent to billing for review and documentation and orders everything was coded correctly.  Should these have been diagnostic or screening labs?

## 2019-08-03 NOTE — Telephone Encounter (Signed)
I spoke with the patient to explain the reason for the diagnostic coding related to his lab work. Patient understood.

## 2019-08-07 NOTE — Telephone Encounter (Signed)
Please let patient know that his procedure was performed due to history of adenomatous colon polyp initial colonoscopy December 2014 This procedure was surveillance. It is still considered screening/surveillance which is how it was coded with some insurance companies treat this differently. Please investigate and and ensure he understands how the procedure was handled by his insurance company Thanks [employer]

## 2019-11-27 ENCOUNTER — Ambulatory Visit
Admission: EM | Admit: 2019-11-27 | Discharge: 2019-11-27 | Disposition: A | Discharge status: Home / Self Care | Payer: 59 | Attending: Emergency Medicine | Admitting: Emergency Medicine

## 2019-11-27 ENCOUNTER — Encounter: Payer: Self-pay | Admitting: Emergency Medicine

## 2019-11-27 DIAGNOSIS — J069 Acute upper respiratory infection, unspecified: Secondary | ICD-10-CM | POA: Diagnosis not present

## 2019-11-27 NOTE — Discharge Instructions (Addendum)
Take ibuprofen and Mucinex as directed.    Your COVID test is pending.  You should self quarantine until the test result is back.    Follow up with your primary care provider if your symptoms are not improving.

## 2019-11-27 NOTE — ED Provider Notes (Signed)
[facility]    CSN: [id number] Arrival date & time: 11/27/19  1100      History   Chief Complaint Chief Complaint  Patient presents with  . Sinus Problem    HPI Christopher Gray is a 59 y.o. male.   Patient presents with sinus pressure, congestion, runny nose, and nonproductive cough x3 days.  He states he has felt sweaty but has not taken his temperature.  He denies chills, sore throat, shortness of breath, vomiting, diarrhea, rash, or other symptoms.  Treatment attempted at home with Claritin.  The history is provided by the patient.    Past Medical History:  Diagnosis Date  . Allergy   . Cataracts, bilateral   . Clotting disorder ([place])    on 325 mg ASA daily for Past hx DVT left   . DVT (deep venous thrombosis) ([place])    1st event 2018.  no clear trigger.    . ED (erectile dysfunction)   . Hyperlipidemia     Patient Active Problem List   Diagnosis Date Noted  . 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 History of DVT in adulthood 11/04/2016  . Advance care planning 12/02/2014  . Routine general medical examination at a health care facility 07/12/2011    Past Surgical History:  Procedure Laterality Date  . CATARACT EXTRACTION, BILATERAL    . COLONOSCOPY    . POLYPECTOMY    . TENDON REPAIR  1999   Crushed right little finger  . WISDOM TOOTH EXTRACTION         Home Medications    Prior to Admission medications   Medication Sig Start Date End Date Taking? Authorizing Provider  Ascorbic Acid (VITAMIN C) 500 MG CAPS Take 500 mg by mouth daily.   Yes [provider]  aspirin 325 MG tablet Take 325 mg by mouth daily.   Yes [provider]  caffeine 200 MG TABS tablet  04/20/19  Yes [provider]  Cholecalciferol (VITAMIN D) 50 MCG (2000 UT) CAPS Take by mouth.   Yes [provider]  CLARITIN 10 MG tablet Take 10 mg by mouth daily as needed.  04/20/19  Yes [provider]  GLUCOSAMINE-CHONDROITIN PO Take 2,000 mg  by mouth daily.   Yes [provider]  Multiple Vitamin (MULTIVITAMIN) tablet Take 1 tablet by mouth daily.     Yes [provider]  Omega-3 Fatty Acids (FISH OIL) 1000 MG CAPS Take 1 capsule by mouth every morning.   Yes [provider]  Turmeric 500 MG CAPS Take 500 mg by mouth daily.   Yes [provider]  XIIDRA 5 % SOLN INSTILL 1 DROP INTO BOTH EYES TWICE A DAY 06/30/19  Yes [provider]    Family History Family History  Problem Relation Age of Onset  . Cancer Mother        bone cancer, unknown primary cancer  . Cancer Father        lung cancer with brain mets, prior smoker  . Stroke Father   . Cancer Sister        Ovarian cancer  . Prostate cancer Neg Hx   . Colon cancer Neg Hx   . Esophageal cancer Neg Hx   . Rectal cancer Neg Hx   . Stomach cancer Neg Hx   . Deep vein thrombosis Neg Hx   . Colon polyps Neg Hx     Social History Social History   Tobacco Use  . Smoking status:  Never Smoker  . Smokeless tobacco: Never Used  Vaping Use  . Vaping Use: Never assessed  Substance Use Topics  . Alcohol use: Yes    Comment: occassional   . Drug use: No     Allergies   Patient has no known allergies.   Review of Systems Review of Systems  Constitutional: Negative for chills and fever.  HENT: Positive for congestion, rhinorrhea and sinus pressure. Negative for ear pain and sore throat.   Eyes: Negative for pain and visual disturbance.  Respiratory: Positive for cough. Negative for shortness of breath.   Cardiovascular: Negative for chest pain and palpitations.  Gastrointestinal: Negative for abdominal pain, diarrhea, nausea and vomiting.  Genitourinary: Negative for dysuria and hematuria.  Musculoskeletal: Negative for arthralgias and back pain.  Skin: Negative for color change and rash.  Neurological: Negative for seizures and syncope.  All other systems reviewed and are negative.    Physical Exam Triage Vital  Signs ED Triage Vitals  Enc Vitals Group     BP 11/27/19 1112 109/71     Pulse Rate 11/27/19 1112 62     Resp 11/27/19 1112 16     Temp 11/27/19 1112 98.6 F (37 C)     Temp src --      SpO2 11/27/19 1112 95 %     Weight --      Height --      Head Circumference --      Peak Flow --      Pain Score 11/27/19 1109 3     Pain Loc --      Pain Edu? --      Excl. in [place]? --    No data found.  Updated Vital Signs BP 109/71   Pulse 62   Temp 98.6 F (37 C)   Resp 16   SpO2 95%   Visual Acuity Right Eye Distance:   Left Eye Distance:   Bilateral Distance:    Right Eye Near:   Left Eye Near:    Bilateral Near:     Physical Exam Vitals and nursing note reviewed.  Constitutional:      General: He is not in acute distress.    Appearance: He is well-developed. He is not ill-appearing.  HENT:     Head: Normocephalic and atraumatic.     Right Ear: Tympanic membrane normal.     Left Ear: Tympanic membrane normal.     Nose: Congestion present.     Mouth/Throat:     Mouth: Mucous membranes are moist.     Pharynx: Oropharynx is clear.  Eyes:     Conjunctiva/sclera: Conjunctivae normal.  Cardiovascular:     Rate and Rhythm: Normal rate and regular rhythm.     Heart sounds: No murmur heard.   Pulmonary:     Effort: Pulmonary effort is normal. No respiratory distress.     Breath sounds: Normal breath sounds.  Abdominal:     Palpations: Abdomen is soft.     Tenderness: There is no abdominal tenderness. There is no guarding or rebound.  Musculoskeletal:     Cervical back: Neck supple.  Skin:    General: Skin is warm and dry.     Findings: No rash.  Neurological:     General: No focal deficit present.     Mental Status: He is alert and oriented to person, place, and time.     Gait: Gait normal.  Psychiatric:        Mood and Affect:  Mood normal.        Behavior: Behavior normal.      UC Treatments / Results  Labs (all labs ordered are listed, but only abnormal  results are displayed) Labs Reviewed  NOVEL CORONAVIRUS, NAA    EKG   Radiology No results found.  Procedures Procedures (including critical care time)  Medications Ordered in UC Medications - No data to display  Initial Impression / Assessment and Plan / UC Course  I have reviewed the triage vital signs and the nursing notes.  Pertinent labs & imaging results that were available during my care of the patient were reviewed by me and considered in my medical decision making (see chart for details).   Upper respiratory infection.  Treating with ibuprofen and Mucinex.  PCR COVID pending.  Instructed patient to self quarantine until the test result is back.  Instructed him to follow-up with his PCP if his symptoms are not improving.  Patient agrees to plan of care.     Final Clinical Impressions(s) / UC Diagnoses   Final diagnoses:  Upper respiratory tract infection, unspecified type     Discharge Instructions     Take ibuprofen and Mucinex as directed.    Your COVID test is pending.  You should self quarantine until the test result is back.    Follow up with your primary care provider if your symptoms are not improving.        ED Prescriptions    None     PDMP not reviewed this encounter.   Sharion Balloon, NP 11/27/19 1137

## 2019-11-27 NOTE — ED Triage Notes (Signed)
Pt c/o facial pressure and runny nose x 3 days. He states at night he feels hot and sweaty to the touch. He states he has never had a sinus infection before.

## 2019-11-28 LAB — NOVEL CORONAVIRUS, NAA: SARS-CoV-2, NAA: DETECTED — AB

## 2019-11-28 LAB — SARS-COV-2, NAA 2 DAY TAT

## 2019-11-29 ENCOUNTER — Telehealth: Payer: Self-pay | Admitting: Physician Assistant

## 2019-11-29 NOTE — Telephone Encounter (Signed)
Called to discuss with patient about Covid symptoms and the use of bamlanivimab/etesevimab or casirivimab/imdevimab, a monoclonal antibody infusion for those with mild to moderate Covid symptoms and at a high risk of hospitalization.  Pt is qualified for this infusion at the [facility] infusion center due to [employer] left to call back our hotline [phone] and sent mychart message.   Angelena Form PA-C  [facility]

## 2019-11-29 NOTE — Telephone Encounter (Signed)
Noted. Thanks.

## 2019-11-30 ENCOUNTER — Telehealth: Payer: Self-pay | Admitting: Physician Assistant

## 2019-11-30 NOTE — Telephone Encounter (Signed)
Called to discuss with Reginia Forts about Covid symptoms and the use of bamlanivimab/etesevimab or casirivimab/imdevimab, a monoclonal antibody infusion for those with mild to moderate Covid symptoms and at a high risk of hospitalization.     Pt is qualified for this infusion at the monoclonal antibody infusion center due to co-morbid conditions and/or a member of an at-risk group, however declines infusion at this time. Symptoms tier reviewed as well as criteria for ending isolation.  Symptoms reviewed that would warrant ED/Hospital evaluation. Preventative practices reviewed. Patient verbalized understanding. Patient advised to call back if he decides that he does want to get infusion. Callback number to the infusion center given. Patient advised to go to Urgent care or ED with severe symptoms. Last date pt would be eligible for infusion is 8/7.   He would like to think more about this. He has our number if he would like to sign up.   Patient Active Problem List   Diagnosis Date Noted  . 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 History of DVT in adulthood 11/04/2016  . Advance care planning 12/02/2014  . Routine general medical examination at a health care facility 07/12/2011    Angelena Form PA-C

## 2019-12-03 ENCOUNTER — Ambulatory Visit
Admission: EM | Admit: 2019-12-03 | Discharge: 2019-12-03 | Disposition: A | Discharge status: Home / Self Care | Payer: 59

## 2019-12-03 DIAGNOSIS — H9202 Otalgia, left ear: Secondary | ICD-10-CM | POA: Diagnosis not present

## 2019-12-03 DIAGNOSIS — U071 COVID-19: Secondary | ICD-10-CM | POA: Diagnosis not present

## 2019-12-03 NOTE — ED Provider Notes (Signed)
Christopher Gray    CSN: [id number] Arrival date & time: 12/03/19  1017      History   Chief Complaint Chief Complaint  Patient presents with  . Otalgia    HPI Christopher Gray is a 59 y.o. male.   Patient presents with left ear pain since last night; worse this morning.  He has ongoing but improved sinus congestion.  Covid positive on 11/27/2019; patient opted not to receive the infusion treatment.  He denies fever, chills, shortness of breath, diarrhea, rash, or other symptoms.  Treatment attempted at home with Mucinex D.  The history is provided by the patient.    Past Medical History:  Diagnosis Date  . Allergy   . Cataracts, bilateral   . Clotting disorder ([place])    on 325 mg ASA daily for Past hx DVT left   . DVT (deep venous thrombosis) ([place])    1st event 2018.  no clear trigger.    . ED (erectile dysfunction)   . Hyperlipidemia     Patient Active Problem List   Diagnosis Date Noted  . 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 History of DVT in adulthood 11/04/2016  . Advance care planning 12/02/2014  . Routine general medical examination at a health care facility 07/12/2011    Past Surgical History:  Procedure Laterality Date  . CATARACT EXTRACTION, BILATERAL    . COLONOSCOPY    . POLYPECTOMY    . TENDON REPAIR  1999   Crushed right little finger  . WISDOM TOOTH EXTRACTION         Home Medications    Prior to Admission medications   Medication Sig Start Date End Date Taking? Authorizing Provider  Ascorbic Acid (VITAMIN C) 500 MG CAPS Take 500 mg by mouth daily.    [provider]  aspirin 325 MG tablet Take 325 mg by mouth daily.    [provider]  caffeine 200 MG TABS tablet  04/20/19   [provider]  Cholecalciferol (VITAMIN D) 50 MCG (2000 UT) CAPS Take by mouth.    [provider]  CLARITIN 10 MG tablet Take 10 mg by mouth daily as needed.  04/20/19   [provider]  GLUCOSAMINE-CHONDROITIN PO Take  2,000 mg by mouth daily.    [provider]  Multiple Vitamin (MULTIVITAMIN) tablet Take 1 tablet by mouth daily.      [provider]  Omega-3 Fatty Acids (FISH OIL) 1000 MG CAPS Take 1 capsule by mouth every morning.    [provider]  Turmeric 500 MG CAPS Take 500 mg by mouth daily.    [provider]  XIIDRA 5 % SOLN INSTILL 1 DROP INTO BOTH EYES TWICE A DAY 06/30/19   [provider]    Family History Family History  Problem Relation Age of Onset  . Cancer Mother        bone cancer, unknown primary cancer  . Cancer Father        lung cancer with brain mets, prior smoker  . Stroke Father   . Cancer Sister        Ovarian cancer  . Prostate cancer Neg Hx   . Colon cancer Neg Hx   . Esophageal cancer Neg Hx   . Rectal cancer Neg Hx   . Stomach cancer Neg Hx   . Deep vein thrombosis Neg Hx   . Colon polyps Neg Hx     Social History Social History   Tobacco  Use  . Smoking status: Never Smoker  . Smokeless tobacco: Never Used  Vaping Use  . Vaping Use: Never assessed  Substance Use Topics  . Alcohol use: Yes    Comment: occassional   . Drug use: No     Allergies   Patient has no known allergies.   Review of Systems Review of Systems  Constitutional: Negative for chills and fever.  HENT: Positive for congestion and ear pain. Negative for sore throat.   Eyes: Negative for pain and visual disturbance.  Respiratory: Negative for cough and shortness of breath.   Cardiovascular: Negative for chest pain and palpitations.  Gastrointestinal: Negative for abdominal pain, diarrhea and vomiting.  Genitourinary: Negative for dysuria and hematuria.  Musculoskeletal: Negative for arthralgias and back pain.  Skin: Negative for color change and rash.  Neurological: Negative for seizures and syncope.  All other systems reviewed and are negative.    Physical Exam Triage Vital Signs ED Triage Vitals  Enc Vitals Group     BP  12/03/19 1019 107/75     Pulse Rate 12/03/19 1019 86     Resp 12/03/19 1019 14     Temp 12/03/19 1019 98.6 F (37 C)     Temp src --      SpO2 12/03/19 1019 97 %     Weight --      Height --      Head Circumference --      Peak Flow --      Pain Score 12/03/19 1018 5     Pain Loc --      Pain Edu? --      Excl. in [place]? --    No data found.  Updated Vital Signs BP 107/75   Pulse 86   Temp 98.6 F (37 C)   Resp 14   SpO2 97%   Visual Acuity Right Eye Distance:   Left Eye Distance:   Bilateral Distance:    Right Eye Near:   Left Eye Near:    Bilateral Near:     Physical Exam Vitals and nursing note reviewed.  Constitutional:      General: He is not in acute distress.    Appearance: He is well-developed. He is not ill-appearing.  HENT:     Head: Normocephalic and atraumatic.     Right Ear: Tympanic membrane and ear canal normal.     Left Ear: Tympanic membrane and ear canal normal.     Nose: Nose normal.     Mouth/Throat:     Mouth: Mucous membranes are moist.     Pharynx: Oropharynx is clear.  Eyes:     Conjunctiva/sclera: Conjunctivae normal.  Cardiovascular:     Rate and Rhythm: Normal rate and regular rhythm.     Heart sounds: No murmur heard.   Pulmonary:     Effort: Pulmonary effort is normal. No respiratory distress.     Breath sounds: Normal breath sounds.  Abdominal:     Palpations: Abdomen is soft.     Tenderness: There is no abdominal tenderness. There is no guarding or rebound.  Musculoskeletal:     Cervical back: Neck supple.  Skin:    General: Skin is warm and dry.     Findings: No rash.  Neurological:     General: No focal deficit present.     Mental Status: He is alert and oriented to person, place, and time.     Gait: Gait normal.  Psychiatric:  Mood and Affect: Mood normal.        Behavior: Behavior normal.      UC Treatments / Results  Labs (all labs ordered are listed, but only abnormal results are displayed) Labs  Reviewed - No data to display  EKG   Radiology No results found.  Procedures Procedures (including critical care time)  Medications Ordered in UC Medications - No data to display  Initial Impression / Assessment and Plan / UC Course  I have reviewed the triage vital signs and the nursing notes.  Pertinent labs & imaging results that were available during my care of the patient were reviewed by me and considered in my medical decision making (see chart for details).   COVID-19.  Otalgia.  Patient is well-appearing and his exam is reassuring.  Patient tested positive for Covid on 11/27/2019; he refused the infusion treatment.  Discussed symptomatic treatment for his ear pain.  Instructed him to continue to self quarantine per CDC guidelines.  Instructed patient to go to the ED if he has acute symptoms.  Patient agrees to plan of care.   Final Clinical Impressions(s) / UC Diagnoses   Final diagnoses:  [id number]  Left ear pain     Discharge Instructions     You should self-quarantine for:  *10 days since your symptoms first appeared and  *24 hours with no fever, without the use of fever-reducing medications and  *your other symptoms of COVID are improving.  Most people do not need to be re-tested at the end of the quarantine period.    Go to the emergency department if you have high fever not relieved by Tylenol, shortness of breath, severe diarrhea, or other concerning symptoms.         ED Prescriptions    None     PDMP not reviewed this encounter.   Sharion Balloon, NP 12/03/19 1041

## 2019-12-03 NOTE — ED Triage Notes (Signed)
Patient reports left sided ear pain that started last night. Reports when he woke up this morning it was worse.

## 2019-12-03 NOTE — Discharge Instructions (Signed)
You should self-quarantine for:  *10 days since your symptoms first appeared and  *24 hours with no fever, without the use of fever-reducing medications and  *your other symptoms of COVID are improving.  Most people do not need to be re-tested at the end of the quarantine period.    Go to the emergency department if you have high fever not relieved by Tylenol, shortness of breath, severe diarrhea, or other concerning symptoms.

## 2020-04-25 ENCOUNTER — Other Ambulatory Visit: Payer: Self-pay | Admitting: Family Medicine

## 2020-04-25 DIAGNOSIS — Z86718 Personal history of other venous thrombosis and embolism: Secondary | ICD-10-CM

## 2020-04-25 DIAGNOSIS — Z8639 Personal history of other endocrine, nutritional and metabolic disease: Secondary | ICD-10-CM

## 2020-04-25 DIAGNOSIS — Z125 Encounter for screening for malignant neoplasm of prostate: Secondary | ICD-10-CM

## 2020-05-09 ENCOUNTER — Other Ambulatory Visit (INDEPENDENT_AMBULATORY_CARE_PROVIDER_SITE_OTHER): Payer: 59

## 2020-05-09 ENCOUNTER — Other Ambulatory Visit: Payer: Self-pay

## 2020-05-09 DIAGNOSIS — Z125 Encounter for screening for malignant neoplasm of prostate: Secondary | ICD-10-CM | POA: Diagnosis not present

## 2020-05-09 DIAGNOSIS — Z8639 Personal history of other endocrine, nutritional and metabolic disease: Secondary | ICD-10-CM

## 2020-05-09 DIAGNOSIS — Z86718 Personal history of other venous thrombosis and embolism: Secondary | ICD-10-CM

## 2020-05-09 LAB — COMPREHENSIVE METABOLIC PANEL
ALT: 16 U/L (ref 0–53)
AST: 17 U/L (ref 0–37)
Albumin: 4.2 g/dL (ref 3.5–5.2)
Alkaline Phosphatase: 69 U/L (ref 39–117)
BUN: 17 mg/dL (ref 6–23)
CO2: 29 mEq/L (ref 19–32)
Calcium: 9.1 mg/dL (ref 8.4–10.5)
Chloride: 106 mEq/L (ref 96–112)
Creatinine, Ser: 1.22 mg/dL (ref 0.40–1.50)
GFR: 65.69 mL/min (ref >60.00)
Glucose, Bld: 98 mg/dL (ref 70–99)
Potassium: 4.7 mEq/L (ref 3.5–5.1)
Sodium: 139 mEq/L (ref 135–145)
Total Bilirubin: 0.6 mg/dL (ref 0.2–1.2)
Total Protein: 6.2 g/dL (ref 6.0–8.3)

## 2020-05-09 LAB — LIPID PANEL
Cholesterol: 156 mg/dL (ref 0–200)
HDL: 42.7 mg/dL (ref >39.00)
LDL Cholesterol: 104 mg/dL — ABNORMAL HIGH (ref 0–99)
NonHDL: 112.91
Total CHOL/HDL Ratio: 4
Triglycerides: 45 mg/dL (ref 0.0–149.0)
VLDL: 9 mg/dL (ref 0.0–40.0)

## 2020-05-09 LAB — CBC WITH DIFFERENTIAL/PLATELET
Basophils Absolute: 0 10*3/uL (ref 0.0–0.1)
Basophils Relative: 0.7 % (ref 0.0–3.0)
Eosinophils Absolute: 0.1 10*3/uL (ref 0.0–0.7)
Eosinophils Relative: 3.2 % (ref 0.0–5.0)
HCT: 46.1 % (ref 39.0–52.0)
Hemoglobin: 15.5 g/dL (ref 13.0–17.0)
Lymphocytes Relative: 24.3 % (ref 12.0–46.0)
Lymphs Abs: 1.1 10*3/uL (ref 0.7–4.0)
MCHC: 33.7 g/dL (ref 30.0–36.0)
MCV: 90.9 fl (ref 78.0–100.0)
Monocytes Absolute: 0.3 10*3/uL (ref 0.1–1.0)
Monocytes Relative: 7.5 % (ref 3.0–12.0)
Neutro Abs: 2.9 10*3/uL (ref 1.4–7.7)
Neutrophils Relative %: 64.3 % (ref 43.0–77.0)
Platelets: 219 10*3/uL (ref 150.0–400.0)
RBC: 5.07 Mil/uL (ref 4.22–5.81)
RDW: 12.5 % (ref 11.5–15.5)
WBC: 4.5 10*3/uL (ref 4.0–10.5)

## 2020-05-09 LAB — PSA: PSA: 0.63 ng/mL (ref 0.10–4.00)

## 2020-05-12 ENCOUNTER — Encounter: Payer: Self-pay | Admitting: Family Medicine

## 2020-05-12 ENCOUNTER — Ambulatory Visit (INDEPENDENT_AMBULATORY_CARE_PROVIDER_SITE_OTHER): Payer: 59 | Admitting: Family Medicine

## 2020-05-12 ENCOUNTER — Other Ambulatory Visit: Payer: Self-pay

## 2020-05-12 VITALS — BP 130/70 | HR 91 | Temp 96.3°F | Ht 72.0 in | Wt 200.5 lb

## 2020-05-12 DIAGNOSIS — Z7189 Other specified counseling: Secondary | ICD-10-CM

## 2020-05-12 DIAGNOSIS — Z0000 Encounter for general adult medical examination without abnormal findings: Secondary | ICD-10-CM

## 2020-05-12 DIAGNOSIS — R239 Unspecified skin changes: Secondary | ICD-10-CM

## 2020-05-12 MED ORDER — VITAMIN C 500 MG PO CAPS: 1000.0000 mg | ORAL_CAPSULE | Freq: Every day | ORAL | Status: AC

## 2020-05-12 NOTE — Progress Notes (Signed)
This visit occurred during the SARS-CoV-2 public health emergency.  Safety protocols were in place, including screening questions prior to the visit, additional usage of staff PPE, and extensive cleaning of exam room while observing appropriate contact time as indicated for disinfecting solutions.  CPE- See plan.  Routine anticipatory guidance given to patient.  See health maintenance.  The possibility exists that previously documented standard health maintenance information may have been brought forward from a previous encounter into this note.  If needed, that same information has been updated to reflect the current situation based on today's encounter.    Tetanus 2014 Flu shot encouraged.  PNA and shingles d/w pt.   covid vaccine encouraged.   Colonoscopy 2021 PSA wnl 2021.   Diet and exercise d/w pt.  Labs d/w pt.  HIV and HCV screening prev neg. Living will d/w pt.He wants his neighbor Roney Jaffe designated if patient were incapacitated.   Dry skin over the eyes.  Hydrocortisone cream didn't help.  D/w pt about trial of dandruff shampoo.  This may make a difference and he can update me as needed.  We could refer to dermatology if needed.  Discussed.  He had covid but taste and smell loss eventually recovered.  He got back to baseline.    PMH and SH reviewed  Meds, vitals, and allergies reviewed.   ROS: Per HPI.  Unless specifically indicated otherwise in HPI, the patient denies:  General: fever. Eyes: acute vision changes ENT: sore throat Cardiovascular: chest pain Respiratory: SOB GI: vomiting GU: dysuria Musculoskeletal: acute back pain Derm: acute rash Neuro: acute motor dysfunction Psych: worsening mood Endocrine: polydipsia Heme: bleeding Allergy: hayfever  GEN: nad, alert and oriented HEENT: NCAT but mild flaking dry skin noted around the orbits. NECK: supple w/o LA CV: rrr. PULM: ctab, no inc wob ABD: soft, +bs EXT: no edema SKIN: no acute rash

## 2020-05-12 NOTE — Patient Instructions (Addendum)
Try dandruff shampoo around your eyes and then rinse.  Let me know if that doesn't work/check with eye clinic clinic.   Take care.  Glad to see you. Get a covid shot.

## 2020-05-16 DIAGNOSIS — R239 Unspecified skin changes: Secondary | ICD-10-CM | POA: Insufficient documentation

## 2020-05-16 NOTE — Assessment & Plan Note (Signed)
Tetanus 2014 Flu shot encouraged.  PNA and shingles d/w pt.   covid vaccine encouraged.   Colonoscopy 2021 PSA wnl 2021.   Diet and exercise d/w pt.  Labs d/w pt.  HIV and HCV screening prev neg. Living will d/w pt.He wants his neighbor Roney Jaffe designated if patient were incapacitated.

## 2020-05-16 NOTE — Assessment & Plan Note (Signed)
Living will d/w pt.He wants his neighbor Barbara Doe designated if patient were incapacitated.   

## 2020-05-16 NOTE — Assessment & Plan Note (Signed)
Dry skin over the eyes.  Hydrocortisone cream didn't help.  D/w pt about trial of dandruff shampoo.  This may make a difference and he can update me as needed.  We could refer to dermatology if needed.  Discussed.

## 2020-05-20 ENCOUNTER — Encounter: Payer: Self-pay | Admitting: Family Medicine

## 2020-08-15 ENCOUNTER — Encounter: Payer: Self-pay | Admitting: Family Medicine

## 2020-08-25 ENCOUNTER — Encounter: Payer: Self-pay | Admitting: Family Medicine

## 2020-08-25 ENCOUNTER — Other Ambulatory Visit: Payer: Self-pay

## 2020-08-25 ENCOUNTER — Ambulatory Visit (INDEPENDENT_AMBULATORY_CARE_PROVIDER_SITE_OTHER): Payer: 59 | Admitting: Family Medicine

## 2020-08-25 DIAGNOSIS — N529 Male erectile dysfunction, unspecified: Secondary | ICD-10-CM

## 2020-08-25 MED ORDER — SILDENAFIL CITRATE 20 MG PO TABS: 60.0000 mg | ORAL_TABLET | Freq: Every day | ORAL | 2 refills | Status: DC | PRN

## 2020-08-25 NOTE — Progress Notes (Signed)
This visit occurred during the SARS-CoV-2 public health emergency.  Safety protocols were in place, including screening questions prior to the visit, additional usage of staff PPE, and extensive cleaning of exam room while observing appropriate contact time as indicated for disinfecting solutions.  Caffeine cautions d/w pt.  He was dating, d/w pt, ED noted.  D/w pt.  Tried sildenafil, 50mg .  No NTG use.  Routine medication cautions d/w pt.    No CP.  He can still exercise w/o troubles.  He started back jogging.  He is tiring  kegel exercises.    Meds, vitals, and allergies reviewed.   ROS: Per HPI unless specifically indicated in ROS section   GEN: nad, alert and oriented HEENT: ncat NECK: supple w/o LA CV: rrr. PULM: ctab, no inc wob ABD: soft, +bs EXT: no edema SKIN: Well-perfused

## 2020-08-25 NOTE — Patient Instructions (Signed)
You can try 60-100mg  of sildenafil as needed.   Take care.  Glad to see you.

## 2020-08-28 NOTE — Assessment & Plan Note (Signed)
Reasonable to trial up to 100 mg of sildenafil with routine cautions.  He will update me as needed.  He agrees with plan.

## 2020-12-05 ENCOUNTER — Ambulatory Visit (INDEPENDENT_AMBULATORY_CARE_PROVIDER_SITE_OTHER): Payer: 59 | Admitting: Family Medicine

## 2020-12-05 ENCOUNTER — Other Ambulatory Visit: Payer: Self-pay

## 2020-12-05 ENCOUNTER — Encounter: Payer: Self-pay | Admitting: Family Medicine

## 2020-12-05 ENCOUNTER — Encounter: Payer: Self-pay | Admitting: *Deleted

## 2020-12-05 VITALS — BP 108/68 | HR 78 | Temp 98.4°F | Ht 72.0 in | Wt 203.5 lb

## 2020-12-05 DIAGNOSIS — M7989 Other specified soft tissue disorders: Secondary | ICD-10-CM

## 2020-12-05 DIAGNOSIS — Z86718 Personal history of other venous thrombosis and embolism: Secondary | ICD-10-CM | POA: Diagnosis not present

## 2020-12-05 DIAGNOSIS — M25462 Effusion, left knee: Secondary | ICD-10-CM | POA: Diagnosis not present

## 2020-12-05 NOTE — Progress Notes (Signed)
Christopher Sowle T. Orvella Digiulio, MD, [place] at [facility] [place], [address]  Phone: [phone]  FAX: [place] - 60 y.o. male  MRN [id number]  Date of Birth: 1960-12-22  Date: 12/05/2020  PCP: Tonia Ghent, MD  Referral: Tonia Ghent, MD  Chief Complaint  Patient presents with  . Leg Pain    Radiates down to foot    This visit occurred during the SARS-CoV-2 public health emergency.  Safety protocols were in place, including screening questions prior to the visit, additional usage of staff PPE, and extensive cleaning of exam room while observing appropriate contact time as indicated for disinfecting solutions.   Subjective:   Christopher Gray is a 60 y.o. very pleasant male patient with Body mass index is 27.6 kg/m. who presents with the following:  R leg pain that radiates down to foot.  He has globally very healthy gentleman, but he does have a history of prior DVT on the left many years ago.  He currently is on daily aspirin, but he does not take any kind of additional anticoagulative therapy.  He has noticed in the last couple of days that his right leg is swollen some compared to the left.  No injury whatsoever including no inversion or eversion of the ankle, he has not hit the front of his foot, stepped in any sort of a hole, or injured his leg or ankle in any way.  He has also not injured his knee at all.   Review of Systems is noted in the HPI, as appropriate  Objective:   BP 108/68   Pulse 78   Temp 98.4 F (36.9 C) (Temporal)   Ht 6' (1.829 m)   Wt 203 lb 8 oz (92.3 kg)   SpO2 95%   BMI 27.60 kg/m   GEN: No acute distress; alert,appropriate. PULM: Breathing comfortably in no respiratory distress PSYCH: Normally interactive.     Rice trace to 1+ lower extremity edema compared to none on the left.  Laboratory and Imaging Data:  Assessment and Plan:     ICD-10-CM    1. Right leg swelling  M79.89 US Venous Img Lower Unilateral Right (DVT)    CANCELED: US SOFT TISSUE LOWER EXTREMITY LIMITED RIGHT (NON-VASCULAR)    2. Effusion of left prepatellar bursa  M25.462 Ambulatory referral to Orthopedic Surgery    3. History of DVT in adulthood  Z86.718 US Venous Img Lower Unilateral Right (DVT)    CANCELED: US SOFT TISSUE LOWER EXTREMITY LIMITED RIGHT (NON-VASCULAR)     New right-sided swelling with concern of potential DVT with a history of left-sided DVT.  DVT and pulmonary embolism can result in potential life-threatening event.  Obtain a right sided ultrasound to evaluate for DVT on the right.  Additional plan of care will be determined by ultrasound findings.  Obvious left-sided very large prepatellar bursa that has been present for many years.  It does provide some discomfort, and at this point he is interested in discussing potential definitive management such as bursectomy.  Orders Placed This Encounter  Procedures  . US Venous Img Lower Unilateral Right (DVT)  . Ambulatory referral to Orthopedic Surgery     Dr[employer] One speech-to-text software was used for transcription in this dictation.  Possible transcriptional errors can occur using Editor, commissioning.   Signed,  Maud Deed. Eyvonne Burchfield, MD   Outpatient Encounter Medications as of 12/05/2020  Medication Sig  .  Ascorbic Acid (VITAMIN C) 500 MG CAPS Take 1,000 mg by mouth daily.  Marland Kitchen aspirin 325 MG tablet Take 325 mg by mouth daily.  . caffeine 200 MG TABS tablet   . Cholecalciferol (VITAMIN D) 50 MCG (2000 UT) CAPS Take by mouth.  Marland Kitchen CLARITIN 10 MG tablet Take 10 mg by mouth daily as needed.   Marland Kitchen GLUCOSAMINE-CHONDROITIN PO Take 2,000 mg by mouth daily.  . Multiple Vitamin (MULTIVITAMIN) tablet Take 1 tablet by mouth daily.  . Omega-3 Fatty Acids (FISH OIL) 1000 MG CAPS Take 1 capsule by mouth every morning.  . sildenafil (REVATIO) 20 MG tablet Take 3-5 tablets (60-100 mg total) by mouth daily  as needed.   No facility-administered encounter medications on file as of 12/05/2020.

## 2020-12-06 ENCOUNTER — Other Ambulatory Visit: Payer: Self-pay

## 2020-12-06 ENCOUNTER — Encounter: Payer: Self-pay | Admitting: Family Medicine

## 2020-12-06 ENCOUNTER — Ambulatory Visit
Admission: RE | Admit: 2020-12-06 | Discharge: 2020-12-06 | Disposition: A | Discharge status: Home / Self Care | Payer: 59 | Source: Ambulatory Visit | Attending: Family Medicine | Admitting: Family Medicine

## 2020-12-06 ENCOUNTER — Telehealth: Payer: Self-pay | Admitting: *Deleted

## 2020-12-06 DIAGNOSIS — Z86718 Personal history of other venous thrombosis and embolism: Secondary | ICD-10-CM | POA: Insufficient documentation

## 2020-12-06 DIAGNOSIS — M7989 Other specified soft tissue disorders: Secondary | ICD-10-CM | POA: Insufficient documentation

## 2020-12-06 NOTE — Telephone Encounter (Signed)
Ashley at [facility] ultrasound called to let Dr. Lorelei Pont know that the ultrasound results are in Epic.Caryl Pina stated that the results are negative for a DVT. Caryl Pina is sending patient home and let him know the office will be in touch with him.

## 2020-12-06 NOTE — Telephone Encounter (Addendum)
Yes, since this is superficial it shouldn't be an ominous issue.  If he continues to have pain there then let me know.  Thanks.

## 2020-12-06 NOTE — Telephone Encounter (Signed)
Do I need to advise him anything about the likely thrombosis in a superficial vessel in the right medial calf at the ankle, which corresponds to the area of patient's pain, that was reported.

## 2020-12-07 NOTE — Telephone Encounter (Signed)
Mr. Weigold notified as instructed by telephone.  Patient states understanding.  MyChart message has been sent to Dr. Damita Dunnings by patient wanting advise on what he can do to avoid clots in the future.  He will await response from Dr. Damita Dunnings.

## 2021-05-03 ENCOUNTER — Other Ambulatory Visit: Payer: Self-pay | Admitting: Family Medicine

## 2021-05-03 ENCOUNTER — Encounter: Payer: Self-pay | Admitting: Family Medicine

## 2021-05-03 DIAGNOSIS — Z8639 Personal history of other endocrine, nutritional and metabolic disease: Secondary | ICD-10-CM

## 2021-05-03 DIAGNOSIS — Z86718 Personal history of other venous thrombosis and embolism: Secondary | ICD-10-CM

## 2021-05-03 DIAGNOSIS — Z125 Encounter for screening for malignant neoplasm of prostate: Secondary | ICD-10-CM

## 2021-05-08 ENCOUNTER — Other Ambulatory Visit: Payer: 59

## 2021-05-09 DIAGNOSIS — H18523 Epithelial (juvenile) corneal dystrophy, bilateral: Secondary | ICD-10-CM | POA: Diagnosis not present

## 2021-05-11 ENCOUNTER — Encounter: Payer: Self-pay | Admitting: Family Medicine

## 2021-05-11 ENCOUNTER — Ambulatory Visit (INDEPENDENT_AMBULATORY_CARE_PROVIDER_SITE_OTHER)
Admission: RE | Admit: 2021-05-11 | Discharge: 2021-05-11 | Disposition: A | Discharge status: Home / Self Care | Payer: 59 | Source: Ambulatory Visit | Attending: Family Medicine | Admitting: Family Medicine

## 2021-05-11 ENCOUNTER — Ambulatory Visit (INDEPENDENT_AMBULATORY_CARE_PROVIDER_SITE_OTHER): Payer: 59 | Admitting: Family Medicine

## 2021-05-11 ENCOUNTER — Other Ambulatory Visit: Payer: Self-pay

## 2021-05-11 VITALS — BP 110/72 | HR 67 | Temp 98.2°F | Ht 72.0 in | Wt 200.0 lb

## 2021-05-11 DIAGNOSIS — Z0000 Encounter for general adult medical examination without abnormal findings: Secondary | ICD-10-CM | POA: Diagnosis not present

## 2021-05-11 DIAGNOSIS — Z7189 Other specified counseling: Secondary | ICD-10-CM

## 2021-05-11 DIAGNOSIS — Z8639 Personal history of other endocrine, nutritional and metabolic disease: Secondary | ICD-10-CM

## 2021-05-11 DIAGNOSIS — M25562 Pain in left knee: Secondary | ICD-10-CM

## 2021-05-11 DIAGNOSIS — R21 Rash and other nonspecific skin eruption: Secondary | ICD-10-CM

## 2021-05-11 DIAGNOSIS — Z86718 Personal history of other venous thrombosis and embolism: Secondary | ICD-10-CM

## 2021-05-11 DIAGNOSIS — M25519 Pain in unspecified shoulder: Secondary | ICD-10-CM

## 2021-05-11 DIAGNOSIS — M7989 Other specified soft tissue disorders: Secondary | ICD-10-CM | POA: Diagnosis not present

## 2021-05-11 DIAGNOSIS — H04129 Dry eye syndrome of unspecified lacrimal gland: Secondary | ICD-10-CM

## 2021-05-11 DIAGNOSIS — Z125 Encounter for screening for malignant neoplasm of prostate: Secondary | ICD-10-CM | POA: Diagnosis not present

## 2021-05-11 LAB — COMPREHENSIVE METABOLIC PANEL
ALT: 19 U/L (ref 0–53)
AST: 24 U/L (ref 0–37)
Albumin: 4.4 g/dL (ref 3.5–5.2)
Alkaline Phosphatase: 67 U/L (ref 39–117)
BUN: 23 mg/dL (ref 6–23)
CO2: 31 mEq/L (ref 19–32)
Calcium: 9.4 mg/dL (ref 8.4–10.5)
Chloride: 104 mEq/L (ref 96–112)
Creatinine, Ser: 1.34 mg/dL (ref 0.40–1.50)
GFR: 58.28 mL/min — ABNORMAL LOW (ref >60.00)
Glucose, Bld: 87 mg/dL (ref 70–99)
Potassium: 4.2 mEq/L (ref 3.5–5.1)
Sodium: 140 mEq/L (ref 135–145)
Total Bilirubin: 1 mg/dL (ref 0.2–1.2)
Total Protein: 6.6 g/dL (ref 6.0–8.3)

## 2021-05-11 LAB — LIPID PANEL
Cholesterol: 161 mg/dL (ref 0–200)
HDL: 47.4 mg/dL (ref >39.00)
LDL Cholesterol: 103 mg/dL — ABNORMAL HIGH (ref 0–99)
NonHDL: 113.37
Total CHOL/HDL Ratio: 3
Triglycerides: 54 mg/dL (ref 0.0–149.0)
VLDL: 10.8 mg/dL (ref 0.0–40.0)

## 2021-05-11 LAB — CBC WITH DIFFERENTIAL/PLATELET
Basophils Absolute: 0 10*3/uL (ref 0.0–0.1)
Basophils Relative: 0.9 % (ref 0.0–3.0)
Eosinophils Absolute: 0.1 10*3/uL (ref 0.0–0.7)
Eosinophils Relative: 2.5 % (ref 0.0–5.0)
HCT: 47.7 % (ref 39.0–52.0)
Hemoglobin: 15.6 g/dL (ref 13.0–17.0)
Lymphocytes Relative: 23.7 % (ref 12.0–46.0)
Lymphs Abs: 1.1 10*3/uL (ref 0.7–4.0)
MCHC: 32.7 g/dL (ref 30.0–36.0)
MCV: 93.2 fl (ref 78.0–100.0)
Monocytes Absolute: 0.4 10*3/uL (ref 0.1–1.0)
Monocytes Relative: 8.2 % (ref 3.0–12.0)
Neutro Abs: 3 10*3/uL (ref 1.4–7.7)
Neutrophils Relative %: 64.7 % (ref 43.0–77.0)
Platelets: 233 10*3/uL (ref 150.0–400.0)
RBC: 5.12 Mil/uL (ref 4.22–5.81)
RDW: 12.7 % (ref 11.5–15.5)
WBC: 4.7 10*3/uL (ref 4.0–10.5)

## 2021-05-11 LAB — PSA: PSA: 0.45 ng/mL (ref 0.10–4.00)

## 2021-05-11 NOTE — Assessment & Plan Note (Signed)
Living will d/w pt. He wants his neighbor Barbara Carter designated if patient were incapacitated.   

## 2021-05-11 NOTE — Patient Instructions (Addendum)
Go to the lab on the way out.   If you have mychart we'll likely use that to update you.    Use the shoulder exercises.  Take care.  Glad to see you.  Try OTC antifungal cream on your groin.   Vaseline near L eye.  Hydrocortisone on the ear canals.

## 2021-05-11 NOTE — Progress Notes (Signed)
This visit occurred during the SARS-CoV-2 public health emergency.  Safety protocols were in place, including screening questions prior to the visit, additional usage of staff PPE, and extensive cleaning of exam room while observing appropriate contact time as indicated for disinfecting solutions.  CPE- See plan.  Routine anticipatory guidance given to patient.  See health maintenance.  The possibility exists that previously documented standard health maintenance information may have been brought forward from a previous encounter into this note.  If needed, that same information has been updated to reflect the current situation based on today's encounter.    Tetanus 2014 Flu shot encouraged.  PNA and shingles d/w pt.   covid vaccine encouraged.   Colonoscopy 2021 PSA pending 2023 Diet and exercise d/w pt.   HIV and HCV screening prev neg.  Living will d/w pt. He wants his neighbor Rosine Door designated if patient were incapacitated.    Longstanding puffiness at the L tibial tubercle.  Minimally ttp.  Not different now but he has h/o kneeling on the L knee, unclear if that was causative.  Discussed getting plain films done today.  See notes on imaging.  He had eye clinic f/u at [facility].  Was told he had dry eyes, didn't improve with treatment.  He had floaters in the meantime.  Recently seen at [facility] clinic, this week.  He was told not to intervene on the floaters if he can tolerate it. He is using artifical tears and then a gel at night for surface irritation/roughness.    He is working on diet and exercise.  He is playing tennis.  R handed.  He noted some L shoulder pain with follow through on a back hand.  Also noted with bench press.  No pop or snap.  No bruising.  No arm drop.  DVT hx noted.  Still on ASA at baseline.  No bleeding.  Doing well.    Rash discussed with patient.  See exam below. PMH and SH reviewed  Meds, vitals, and allergies reviewed.   ROS: Per HPI.   Unless specifically indicated otherwise in HPI, the patient denies:  General: fever. Eyes: acute vision changes ENT: sore throat Cardiovascular: chest pain Respiratory: SOB GI: vomiting GU: dysuria Musculoskeletal: acute back pain Derm: acute rash Neuro: acute motor dysfunction Psych: worsening mood Endocrine: polydipsia Heme: bleeding Allergy: hayfever  GEN: nad, alert and oriented HEENT: ncat NECK: supple w/o LA CV: rrr. PULM: ctab, no inc wob ABD: soft, +bs EXT: no edema SKIN: well perfused but L groin with superficial fungal infection.  He has a small amount of skin irritation lateral to the left eye along with some irritation in the ear canals. L shoulder pain with ext rotation, improved with scap manipulation.  No arm drop.  No pain on int rotation.   L tibial tubercle puffy at baseline, not red.

## 2021-05-14 DIAGNOSIS — R21 Rash and other nonspecific skin eruption: Secondary | ICD-10-CM | POA: Insufficient documentation

## 2021-05-14 DIAGNOSIS — H04129 Dry eye syndrome of unspecified lacrimal gland: Secondary | ICD-10-CM | POA: Insufficient documentation

## 2021-05-14 DIAGNOSIS — M25562 Pain in left knee: Secondary | ICD-10-CM | POA: Insufficient documentation

## 2021-05-14 DIAGNOSIS — M25519 Pain in unspecified shoulder: Secondary | ICD-10-CM | POA: Insufficient documentation

## 2021-05-14 NOTE — Assessment & Plan Note (Signed)
Reasonable to continue with aspirin.  No bleeding.  Doing well.

## 2021-05-14 NOTE — Assessment & Plan Note (Signed)
Left shoulder pain with external rotation improved with scapular manipulation and he likely has rotator cuff symptoms.  Discussed with patient about options.  Home exercise program paperwork given to patient.  Explained and demonstrated.  He understood.  He can use home exercise program and update me if not better.

## 2021-05-14 NOTE — Assessment & Plan Note (Signed)
Tetanus 2014 Flu shot encouraged.  PNA and shingles d/w pt.   covid vaccine encouraged.   Colonoscopy 2021 PSA pending 2023 Diet and exercise d/w pt.   HIV and HCV screening prev neg.  Living will d/w pt. He wants his neighbor Rosine Door designated if patient were incapacitated.

## 2021-05-14 NOTE — Assessment & Plan Note (Addendum)
Per eye clinic.  I will defer. 

## 2021-05-14 NOTE — Assessment & Plan Note (Signed)
With history of tenderness at the tibial tubercle.  Local fullness noted.  See plain films.  Is not fluctuant and no redness.  No concern for an acute process.

## 2021-05-14 NOTE — Assessment & Plan Note (Signed)
Potentially 2 separate issues.  Discussed options  Try OTC antifungal cream on the groin-likely superficial fungal infection  Reasonable to use Vaseline near L eye.  Can use hydrocortisone on the ear canals for local irritation.

## 2021-05-30 ENCOUNTER — Encounter: Payer: Self-pay | Admitting: Family Medicine

## 2021-05-31 ENCOUNTER — Other Ambulatory Visit: Payer: Self-pay | Admitting: Family Medicine

## 2021-05-31 MED ORDER — KETOCONAZOLE 2 % EX CREA: 1.0000 "application " | TOPICAL_CREAM | Freq: Every day | CUTANEOUS | 0 refills | Status: DC

## 2021-06-09 DIAGNOSIS — H18523 Epithelial (juvenile) corneal dystrophy, bilateral: Secondary | ICD-10-CM | POA: Diagnosis not present

## 2021-09-04 ENCOUNTER — Encounter: Payer: Self-pay | Admitting: Family Medicine

## 2021-09-06 ENCOUNTER — Telehealth: Payer: Self-pay | Admitting: Family Medicine

## 2021-09-06 NOTE — Telephone Encounter (Signed)
Please triage patient about rash/fungal symptoms.  He may need an office visit.  Thanks. ?

## 2021-09-06 NOTE — Telephone Encounter (Signed)
Unable to reach pt by phone and left v/m requesting pt to call [facility] for triage. ?

## 2021-09-07 NOTE — Telephone Encounter (Signed)
I spoke with pt and he said since first of year rash in personal area and now dry spot on eyelid gets better but does not go away. Pt said ketoconazole cream did help rash but rash never completely cleared.Rash will get better and then worse. Pt has tried OTC med with no relief. Pt said he was advised to try vaseline on eyelid so lately he has been applying vaseline to other areas with rash and has not gone away but has gone from red to pink in color. Rash itches on and off. Offered pt appt today but pt is working and cannot come in today and prefers to see Dr Damita Dunnings. Pt scheduled appt with Dr Damita Dunnings on 09/11/21 at 3pm. Sending note to Dr Damita Dunnings. ?

## 2021-09-07 NOTE — Telephone Encounter (Signed)
Noted. Thanks.

## 2021-09-11 ENCOUNTER — Encounter: Payer: Self-pay | Admitting: Family Medicine

## 2021-09-11 ENCOUNTER — Ambulatory Visit (INDEPENDENT_AMBULATORY_CARE_PROVIDER_SITE_OTHER): Payer: 59 | Admitting: Family Medicine

## 2021-09-11 DIAGNOSIS — R21 Rash and other nonspecific skin eruption: Secondary | ICD-10-CM | POA: Diagnosis not present

## 2021-09-11 DIAGNOSIS — D179 Benign lipomatous neoplasm, unspecified: Secondary | ICD-10-CM

## 2021-09-11 MED ORDER — TRIAMCINOLONE ACETONIDE 0.1 % EX CREA: 1.0000 "application " | TOPICAL_CREAM | Freq: Two times a day (BID) | CUTANEOUS | 1 refills | Status: AC

## 2021-09-11 NOTE — Patient Instructions (Signed)
Likely a benign lipoma on the right arm.  I wouldn't do anything about that now.  Update me if more bothersome.  ? ?I would try triamcinolone on the rash.  Update me in about 1 week.  ? ?Take care.  Glad to see you. ? ?

## 2021-09-11 NOTE — Progress Notes (Signed)
Follow-up for rash in left groin.  No STD exposure.  No change with ketoconazole.  Used up the rx without significant change.  Tried vaseline and that helped a little, less irritated now.  No dysuria.  No testicle pain.  No FCNAVD.   ? ?Skin lesion incidentally noted on the right upper arm, patient wanted this evaluated. ? ?Meds, vitals, and allergies reviewed.  ? ?ROS: Per HPI unless specifically indicated in ROS section  ? ?Nad ?Ncat ?Blanching erythematous rash in the left groin without ulceration. ?Rubbery lesion that feels like a lipoma noted on the right upper arm.  No ulceration bruising or erythema. ?

## 2021-09-13 DIAGNOSIS — D179 Benign lipomatous neoplasm, unspecified: Secondary | ICD-10-CM | POA: Insufficient documentation

## 2021-09-13 DIAGNOSIS — M7989 Other specified soft tissue disorders: Secondary | ICD-10-CM | POA: Insufficient documentation

## 2021-09-13 NOTE — Assessment & Plan Note (Signed)
Vaseline seems to help some.  He did not get any improvement with antifungal treatment.  He can use triamcinolone with routine cautions and update me in about 1 week. ?

## 2021-09-13 NOTE — Assessment & Plan Note (Signed)
Discussed.  No intervention needed.  He can update me if enlarging. ?

## 2021-09-20 ENCOUNTER — Telehealth: Payer: Self-pay | Admitting: Family Medicine

## 2021-09-20 DIAGNOSIS — R4589 Other symptoms and signs involving emotional state: Secondary | ICD-10-CM

## 2021-09-20 NOTE — Telephone Encounter (Signed)
Pt returned call and stated if someone can call him back because pt stated he can not keep taking off of work . I let pt know that dr Damita Dunnings wanted to see him in office for his  depression Pt can be reached at [phone]

## 2021-09-20 NOTE — Telephone Encounter (Signed)
Reason for Referral Request: Depression/happy enough  Has patient been seen PCP for this complaint? No  Patient scheduled on: Was last seen on 09/11/2021  Referral for which specialty: Psychiatrist   Preferred office/provider: N/A

## 2021-09-20 NOTE — Telephone Encounter (Signed)
LMTCB to discuss. Patient has not been seend for depression before and will need to make an appt before referral can be made

## 2021-09-21 NOTE — Telephone Encounter (Signed)
Response has been added to other TE patient was referring to. Closing note.

## 2021-09-21 NOTE — Telephone Encounter (Signed)
   Response from duplicate phone note that was opened. Patient does not want to come back in for another appt. Please advise.

## 2021-09-22 NOTE — Telephone Encounter (Signed)
I put in the referral in the meantime.  Please triage patient and offer office visit here in the meantime.  I do not know how soon he will be able to be seen by psychiatry.  Thanks.

## 2021-09-22 NOTE — Addendum Note (Signed)
Addended by: Tonia Ghent on: 09/22/2021 07:19 AM   Modules accepted: Orders

## 2021-09-22 NOTE — Telephone Encounter (Signed)
I spoke with pt; pt said he is willing to come in and talk with Dr Damita Dunnings and pt is not sure if needs psychiatric referral or not; wants to talk more about that with Dr Damita Dunnings. Pt said his lady friend is the one who wants pt to see psychiatrist because she does not think he is as happy as he was. Pt said he is happy but on and off as slight depression. Pt believes in mind over matter and thinks he is on an even keel right now. No SI/HI. Pt scheduled appt with Dr Damita Dunnings on 09/26/21 at 3 PM. UC & ED precautions given and pt voiced understanding. Sending note to Dr Damita Dunnings and Janett Billow CMA.

## 2021-09-25 NOTE — Telephone Encounter (Signed)
Noted. Thanks.

## 2021-09-26 ENCOUNTER — Ambulatory Visit (INDEPENDENT_AMBULATORY_CARE_PROVIDER_SITE_OTHER): Payer: 59 | Admitting: Family Medicine

## 2021-09-26 ENCOUNTER — Encounter: Payer: Self-pay | Admitting: Family Medicine

## 2021-09-26 DIAGNOSIS — R4586 Emotional lability: Secondary | ICD-10-CM

## 2021-09-26 DIAGNOSIS — R69 Illness, unspecified: Secondary | ICD-10-CM | POA: Diagnosis not present

## 2021-09-26 MED ORDER — VENLAFAXINE HCL ER 37.5 MG PO CP24: 37.5000 mg | ORAL_CAPSULE | Freq: Every day | ORAL | 1 refills | Status: DC

## 2021-09-26 NOTE — Patient Instructions (Signed)
Try venlafaxine 37.5 mg a day and update me in about 2 weeks, sooner if needed.  Take care.  Glad to see you.

## 2021-09-26 NOTE — Progress Notes (Unsigned)
Rash in L groin clearly improved in a few days with triamcinolone topical.  He was able to stop using med in the meantime.    Mood d/w pt.  Another person noted that he wasn't having much fun.  He is still paying tennis and he is learning to play.  He gets irritated when he misses a shot.  His partner noted that he was more self critical.  He isn't directing this at anyone else.  "I'm having trouble getting to the mellow point of being positive."  He isn't miserable but isn't having as much fun.  No SI/HI.  Some fatigue and dec in motivation.    No illicits.  Rare etoh.  No manic episodes.    Meds, vitals, and allergies reviewed.   ROS: Per HPI unless specifically indicated in ROS section

## 2021-09-27 DIAGNOSIS — R4586 Emotional lability: Secondary | ICD-10-CM | POA: Insufficient documentation

## 2021-09-27 NOTE — Assessment & Plan Note (Signed)
See above.  No SI/HI.  Okay for patient f/u.  Discussed options with SSRI vs SNRI vs observation.  Reasonable to try venlafaxine 37.5 mg a day and update me in about 2 weeks, sooner if needed.  Routine cautions given to patient and he agrees. Can cancel psych referral later if needed.

## 2021-09-29 ENCOUNTER — Encounter: Payer: Self-pay | Admitting: Family Medicine

## 2021-10-01 ENCOUNTER — Encounter: Payer: Self-pay | Admitting: Family Medicine

## 2022-03-17 DIAGNOSIS — J029 Acute pharyngitis, unspecified: Secondary | ICD-10-CM | POA: Diagnosis not present

## 2022-03-17 DIAGNOSIS — Z6827 Body mass index (BMI) 27.0-27.9, adult: Secondary | ICD-10-CM | POA: Diagnosis not present

## 2022-03-17 DIAGNOSIS — H66002 Acute suppurative otitis media without spontaneous rupture of ear drum, left ear: Secondary | ICD-10-CM | POA: Diagnosis not present

## 2022-03-19 ENCOUNTER — Telehealth: Payer: Self-pay | Admitting: Family Medicine

## 2022-03-19 NOTE — Telephone Encounter (Signed)
Pt called stating he went to the [facility]& was diagnosed with a "inner ear infection". Pt wants advice on what to do to take car of infection? Call back # [phone]

## 2022-03-20 NOTE — Telephone Encounter (Signed)
Spoke with patient and he is starting to feel better now. Patient states the [facility] put him on amoxicillin Friday for the ear infection. Patient states they gave him a weeks worth. Advised patient if no better after course will need a recheck here in office if possible. Patient verbalized understanding.

## 2022-03-27 ENCOUNTER — Encounter: Payer: Self-pay | Admitting: Family Medicine

## 2022-03-29 ENCOUNTER — Telehealth: Payer: Self-pay | Admitting: Family Medicine

## 2022-03-29 DIAGNOSIS — Z1152 Encounter for screening for COVID-19: Secondary | ICD-10-CM | POA: Diagnosis not present

## 2022-03-29 DIAGNOSIS — Z03818 Encounter for observation for suspected exposure to other biological agents ruled out: Secondary | ICD-10-CM | POA: Diagnosis not present

## 2022-03-29 DIAGNOSIS — Z6826 Body mass index (BMI) 26.0-26.9, adult: Secondary | ICD-10-CM | POA: Diagnosis not present

## 2022-03-29 NOTE — Telephone Encounter (Signed)
Please triage patient about his recent symptoms.  I need more detail.  Please see MyChart message.  Thanks.

## 2022-03-29 NOTE — Telephone Encounter (Signed)
Noted. Thanks.

## 2022-03-29 NOTE — Telephone Encounter (Signed)
I spoke with pt; pt said he was seen at [facility] over a wk ago and given amoxicillin for inner ear infection. Pt said when finished abx pt felt good with no symptoms. The first of this week pt began with head and chest congestion; pt said he does not have to blow his nose and has a dry cough occasionally but when pt has cough pt has pressure in forehead each time he coughs. Pt said last night he had chills for a short period and then the chills just went away. Today no fever, no S/T, no earache, no dizziness,no CP and no SOB. Pt said the other main complaint is that he feels really tired with no energy; for last 2 - 3 nights pt comes home from work and goes to sleep which is very unusual. Pt has not had recent covid test. Pt said he has been taking OTC cold and flu med this week but has not helped symptoms. Pt said he took off work today and no available appts at [facility] or [facility] pt is going to [facility] in [place]. Pt will cb for appt if needed for FU. Sending note to Dr Damita Dunnings and Damita Dunnings pool.

## 2022-04-09 ENCOUNTER — Encounter: Payer: Self-pay | Admitting: Family Medicine

## 2022-04-10 ENCOUNTER — Other Ambulatory Visit: Payer: Self-pay | Admitting: Family Medicine

## 2022-04-10 MED ORDER — VALACYCLOVIR HCL 1 G PO TABS: 2000.0000 mg | ORAL_TABLET | Freq: Two times a day (BID) | ORAL | 1 refills | Status: DC

## 2022-04-25 ENCOUNTER — Encounter: Payer: Self-pay | Admitting: Family Medicine

## 2022-04-29 ENCOUNTER — Other Ambulatory Visit: Payer: Self-pay | Admitting: Family Medicine

## 2022-04-29 DIAGNOSIS — Z8639 Personal history of other endocrine, nutritional and metabolic disease: Secondary | ICD-10-CM

## 2022-04-29 DIAGNOSIS — Z125 Encounter for screening for malignant neoplasm of prostate: Secondary | ICD-10-CM

## 2022-04-29 DIAGNOSIS — N529 Male erectile dysfunction, unspecified: Secondary | ICD-10-CM

## 2022-05-07 ENCOUNTER — Other Ambulatory Visit (INDEPENDENT_AMBULATORY_CARE_PROVIDER_SITE_OTHER): Payer: 59

## 2022-05-07 DIAGNOSIS — Z8639 Personal history of other endocrine, nutritional and metabolic disease: Secondary | ICD-10-CM | POA: Diagnosis not present

## 2022-05-07 DIAGNOSIS — N529 Male erectile dysfunction, unspecified: Secondary | ICD-10-CM | POA: Diagnosis not present

## 2022-05-07 DIAGNOSIS — Z125 Encounter for screening for malignant neoplasm of prostate: Secondary | ICD-10-CM | POA: Diagnosis not present

## 2022-05-07 LAB — CBC WITH DIFFERENTIAL/PLATELET
Basophils Absolute: 0 10*3/uL (ref 0.0–0.1)
Basophils Relative: 0.4 % (ref 0.0–3.0)
Eosinophils Absolute: 0.2 10*3/uL (ref 0.0–0.7)
Eosinophils Relative: 5 % (ref 0.0–5.0)
HCT: 46.7 % (ref 39.0–52.0)
Hemoglobin: 15.9 g/dL (ref 13.0–17.0)
Lymphocytes Relative: 25.8 % (ref 12.0–46.0)
Lymphs Abs: 1.3 10*3/uL (ref 0.7–4.0)
MCHC: 34.2 g/dL (ref 30.0–36.0)
MCV: 90.9 fl (ref 78.0–100.0)
Monocytes Absolute: 0.3 10*3/uL (ref 0.1–1.0)
Monocytes Relative: 7 % (ref 3.0–12.0)
Neutro Abs: 3 10*3/uL (ref 1.4–7.7)
Neutrophils Relative %: 61.8 % (ref 43.0–77.0)
Platelets: 231 10*3/uL (ref 150.0–400.0)
RBC: 5.13 Mil/uL (ref 4.22–5.81)
RDW: 13.2 % (ref 11.5–15.5)
WBC: 4.8 10*3/uL (ref 4.0–10.5)

## 2022-05-07 LAB — COMPREHENSIVE METABOLIC PANEL
ALT: 32 U/L (ref 0–53)
AST: 22 U/L (ref 0–37)
Albumin: 4.2 g/dL (ref 3.5–5.2)
Alkaline Phosphatase: 66 U/L (ref 39–117)
BUN: 27 mg/dL — ABNORMAL HIGH (ref 6–23)
CO2: 28 mEq/L (ref 19–32)
Calcium: 9.2 mg/dL (ref 8.4–10.5)
Chloride: 107 mEq/L (ref 96–112)
Creatinine, Ser: 1.29 mg/dL (ref 0.40–1.50)
GFR: 60.58 mL/min (ref >60.00)
Glucose, Bld: 102 mg/dL — ABNORMAL HIGH (ref 70–99)
Potassium: 4.4 mEq/L (ref 3.5–5.1)
Sodium: 142 mEq/L (ref 135–145)
Total Bilirubin: 0.6 mg/dL (ref 0.2–1.2)
Total Protein: 6.4 g/dL (ref 6.0–8.3)

## 2022-05-07 LAB — LIPID PANEL
Cholesterol: 174 mg/dL (ref 0–200)
HDL: 41.4 mg/dL (ref >39.00)
LDL Cholesterol: 121 mg/dL — ABNORMAL HIGH (ref 0–99)
NonHDL: 132.89
Total CHOL/HDL Ratio: 4
Triglycerides: 58 mg/dL (ref 0.0–149.0)
VLDL: 11.6 mg/dL (ref 0.0–40.0)

## 2022-05-07 LAB — PSA: PSA: 0.61 ng/mL (ref 0.10–4.00)

## 2022-05-07 LAB — TESTOSTERONE: Testosterone: 342.84 ng/dL (ref 300.00–890.00)

## 2022-05-14 ENCOUNTER — Ambulatory Visit (INDEPENDENT_AMBULATORY_CARE_PROVIDER_SITE_OTHER): Payer: 59 | Admitting: Family Medicine

## 2022-05-14 ENCOUNTER — Encounter: Payer: Self-pay | Admitting: Family Medicine

## 2022-05-14 VITALS — BP 122/78 | HR 64 | Temp 97.1°F | Ht 72.0 in | Wt 205.0 lb

## 2022-05-14 DIAGNOSIS — Z7189 Other specified counseling: Secondary | ICD-10-CM

## 2022-05-14 DIAGNOSIS — R4586 Emotional lability: Secondary | ICD-10-CM

## 2022-05-14 DIAGNOSIS — Z8619 Personal history of other infectious and parasitic diseases: Secondary | ICD-10-CM

## 2022-05-14 DIAGNOSIS — Z0000 Encounter for general adult medical examination without abnormal findings: Secondary | ICD-10-CM

## 2022-05-14 NOTE — Progress Notes (Signed)
CPE- See plan.  Routine anticipatory guidance given to patient.  See health maintenance.  The possibility exists that previously documented standard health maintenance information may have been brought forward from a previous encounter into this note.  If needed, that same information has been updated to reflect the current situation based on today's encounter.    Tetanus 2014 Flu shot encouraged.  PNA and shingles d/w pt.   covid vaccine encouraged.   Colonoscopy 2021 PSA pending 2024 Diet and exercise d/w pt.   HIV and HCV screening prev neg.  Living will d/w pt. He wants his neighbor Rosine Door designated if patient were incapacitated.    Prev nausea with effexor. He is off med. D/w pt about managing irritability.  No SI/HI.  He can manage as is.    Valtrex helped with cold sore, d/w pt about PRN use.  He'll update me as needed.   He is still playing tennis.  He is active, playing mult times per week.  He is cutting out hot dogs in the meantime.  Diet d/w pt.   He if off caffeine in the meantime.    Labs d/w pt.   The 10-year ASCVD risk score (Arnett DK, et al., 2019) is: 7.4%   Values used to calculate the score:     Age: 62 years     Sex: Male     Is Non-Hispanic African American: No     Diabetic: No     Tobacco smoker: No     Systolic Blood Pressure: 388 mmHg     Is BP treated: No     HDL Cholesterol: 41.4 mg/dL     Total Cholesterol: 174 mg/dL  Still on aspirin at baseline.  Discussed continuing aspirin and continue work on diet and exercise.  PMH and SH reviewed  Meds, vitals, and allergies reviewed.   ROS: Per HPI.  Unless specifically indicated otherwise in HPI, the patient denies:  General: fever. Eyes: acute vision changes ENT: sore throat Cardiovascular: chest pain Respiratory: SOB GI: vomiting GU: dysuria Musculoskeletal: acute back pain Derm: acute rash Neuro: acute motor dysfunction Psych: worsening mood Endocrine: polydipsia Heme:  bleeding Allergy: hayfever  GEN: nad, alert and oriented HEENT: mucous membranes moist NECK: supple w/o LA CV: rrr. PULM: ctab, no inc wob ABD: soft, +bs EXT: no edema SKIN: no acute rash

## 2022-05-14 NOTE — Patient Instructions (Signed)
Keep working on diet and exercise.  Update me as needed.   Take care.  Glad to see you. I would get a flu shot each fall.

## 2022-05-16 NOTE — Assessment & Plan Note (Signed)
Tetanus 2014 Flu shot encouraged.  PNA and shingles d/w pt.   covid vaccine encouraged.   Colonoscopy 2021 PSA pending 2024 Diet and exercise d/w pt.   HIV and HCV screening prev neg.  Living will d/w pt. He wants his neighbor Rosine Door designated if patient were incapacitated.

## 2022-05-16 NOTE — Assessment & Plan Note (Signed)
Prev nausea with effexor. He is off med. D/w pt about managing irritability.  No SI/HI.  He can manage as is.

## 2022-05-16 NOTE — Assessment & Plan Note (Signed)
Living will d/w pt. He wants his neighbor Rosine Door designated if patient were incapacitated.

## 2022-05-16 NOTE — Assessment & Plan Note (Signed)
Valtrex helped with cold sore, d/w pt about PRN use.  He'll update me as needed.

## 2022-08-14 IMAGING — US US EXTREM LOW VENOUS*R*
1 series · 13 of 24 positions shown · non-contrast
Comparison: None.

CLINICAL DATA: 60-year-old male with right lower extremity
swelling. History of prior DVT

EXAM:
RIGHT LOWER EXTREMITY VENOUS DOPPLER ULTRASOUND
TECHNIQUE: Gray-scale sonography with compression, as well as color and duplex
ultrasound, were performed to evaluate the deep venous system(s)
from the level of the common femoral vein through the popliteal and
proximal calf veins.

[Series 1: us extrem low venous*right* · 0.08mm/px · 13 of 47 slices shown]
[im 1/47]
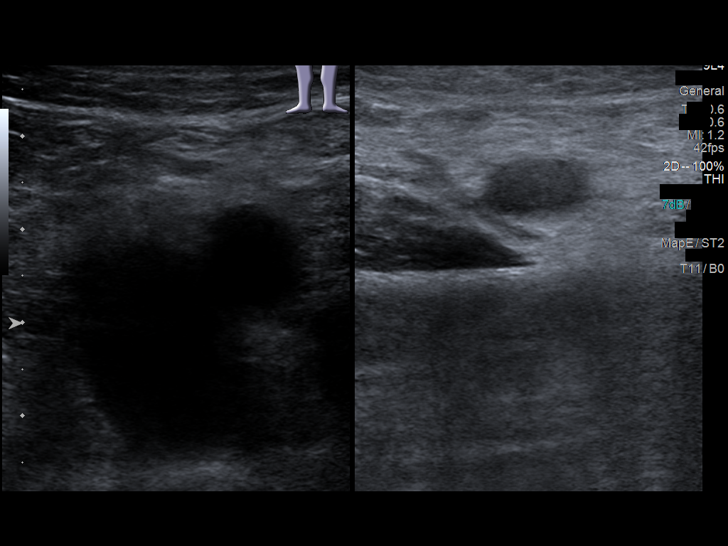
[im 5/47]
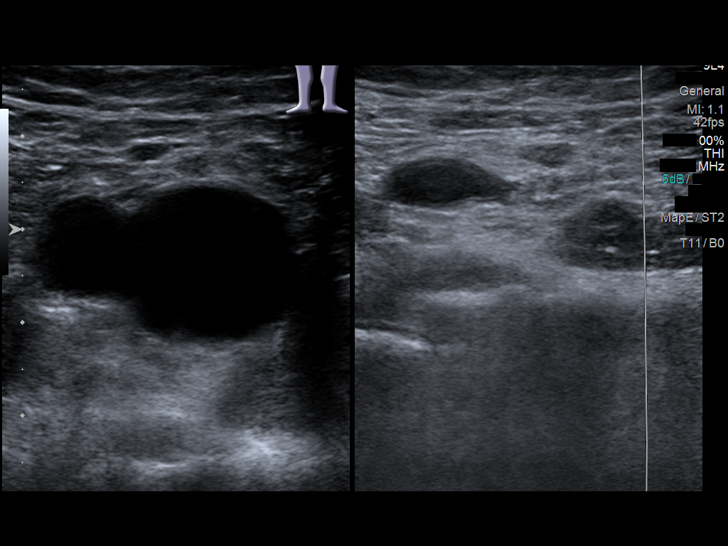
[im 9/47]
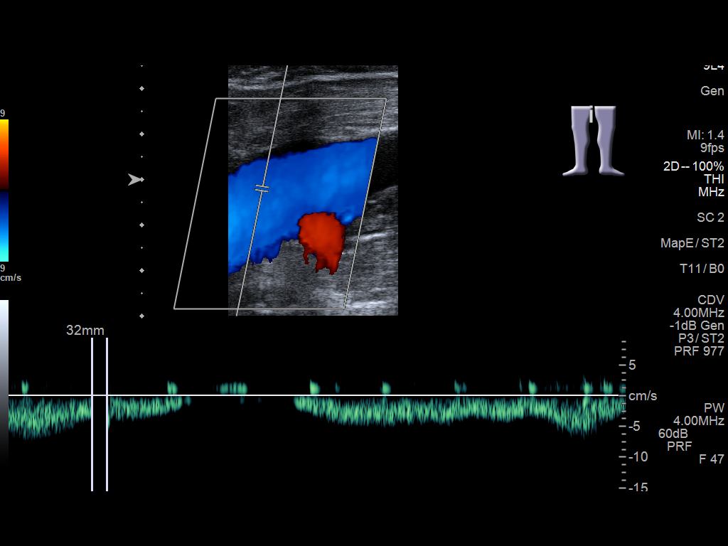
[im 13/47]
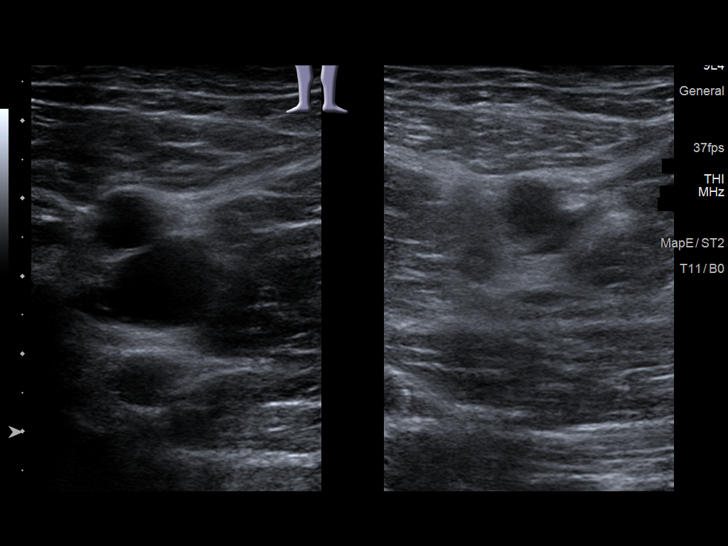
[im 17/47]
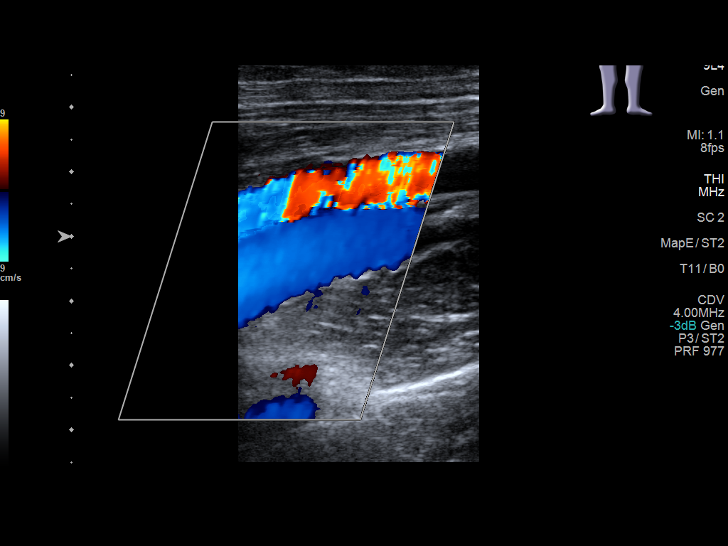
[im 21/47]
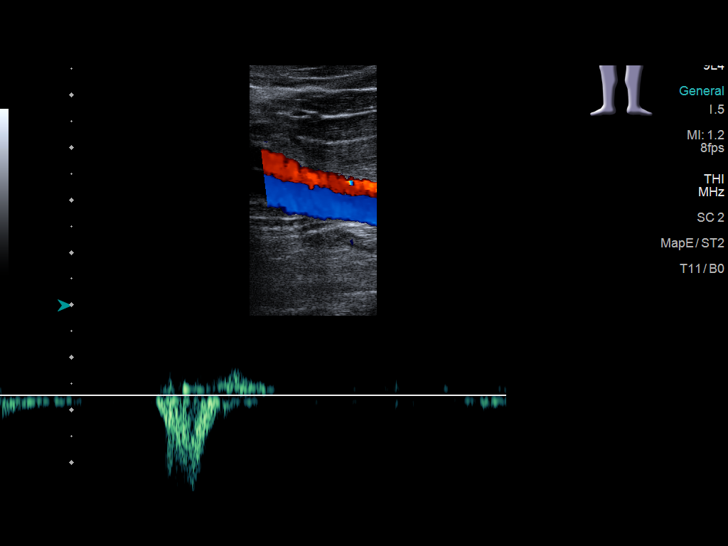
[im 25/47]
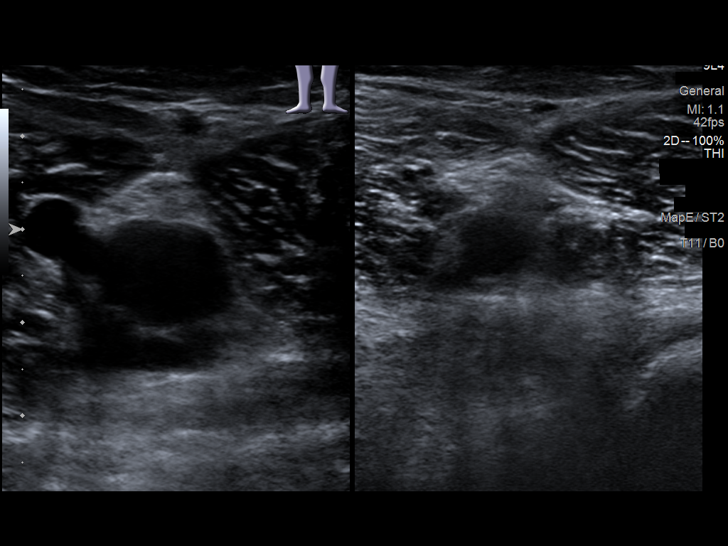
[im 27/47]
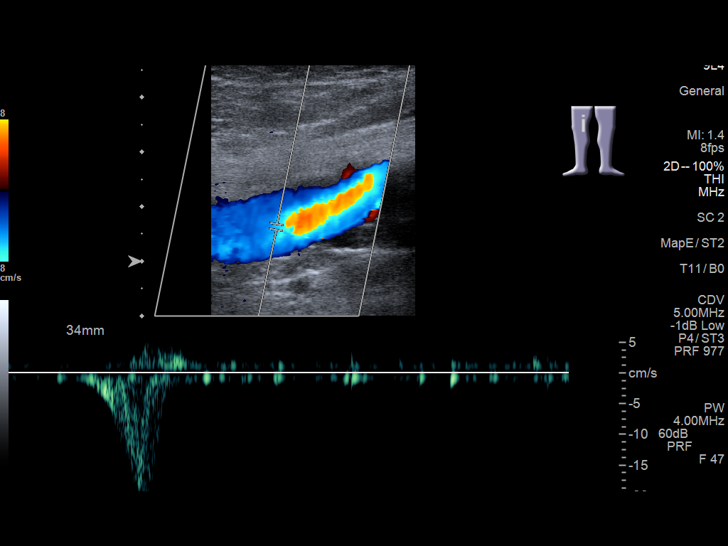
[im 31/47]
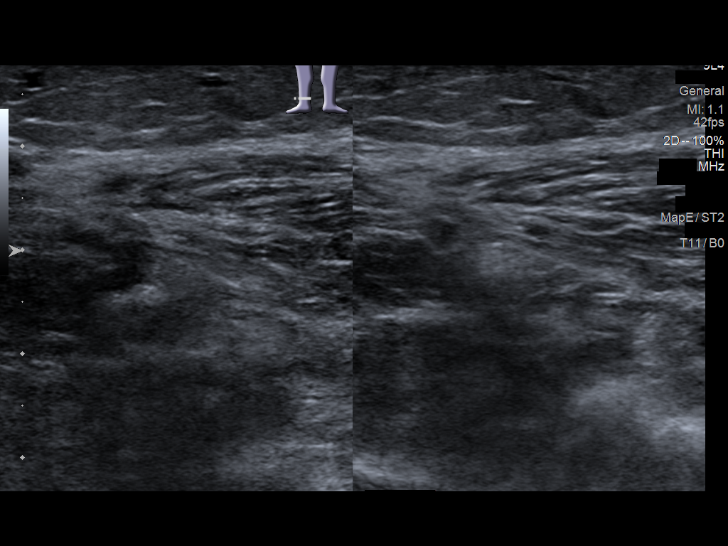
[im 35/47]
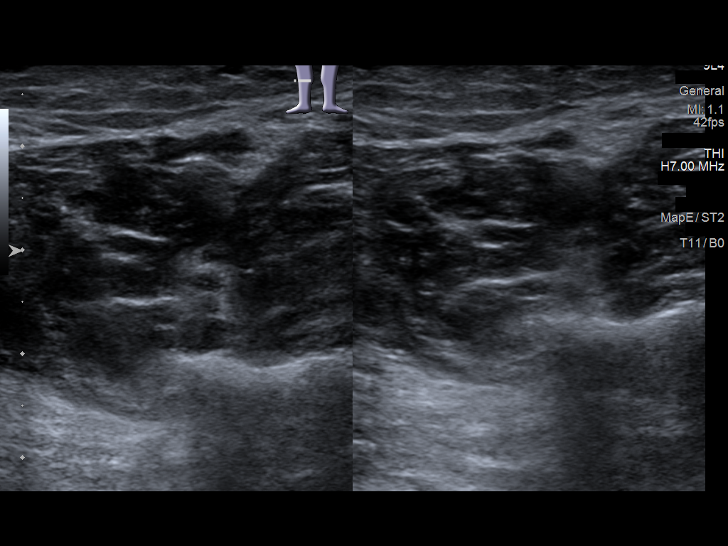
[im 39/47]
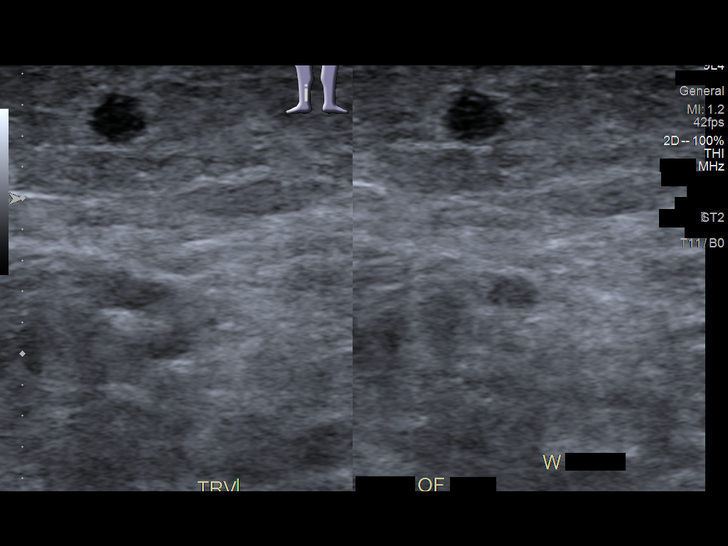
[im 43/47]
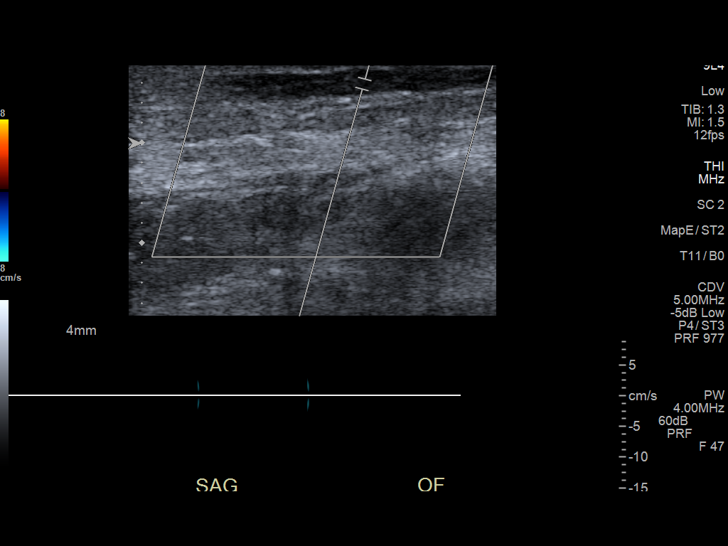
[im 47/47]
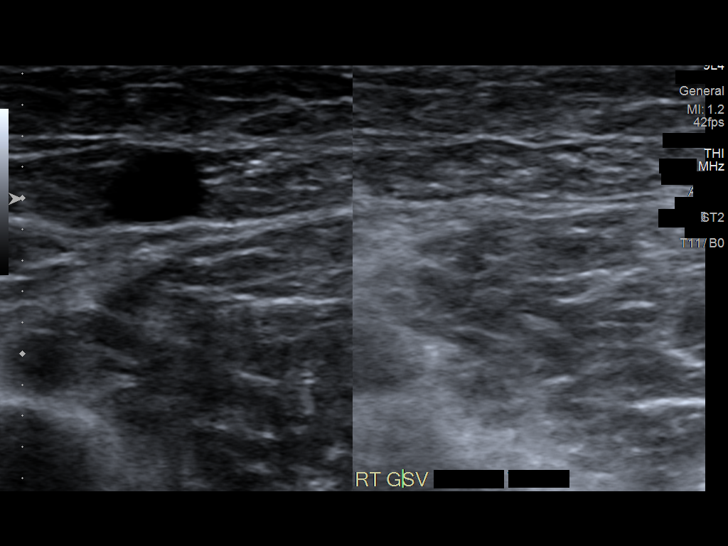

[13 of 24 positions shown; findings below may reference images not displayed]

FINDINGS: VENOUS

In the right medial calf at the ankle, there appears to be a
thrombosed superficial vessel, which corresponds to the patient's
area of pain. This vessel is not compressible and does not
demonstrate flow on Doppler imaging.

Normal compressibility of the common femoral, superficial femoral,
and popliteal veins, as well as the visualized deep calf veins.
Visualized portions of profunda femoral vein and great saphenous
vein unremarkable. No filling defects to suggest DVT on grayscale or
color Doppler imaging. Doppler waveforms show normal direction of
venous flow, normal respiratory plasticity and response to
augmentation.

Limited views of the contralateral common femoral vein are
unremarkable.

OTHER

None.

Limitations: none
IMPRESSION: 1. Negative for deep venous thrombosis in the right leg.

2. Likely thrombosis in a superficial vessel in the right medial
calf at the ankle, which corresponds to the area of patient's pain.

## 2023-01-17 IMAGING — DX DG KNEE 3 VIEWS*L*
3 series · 3 of 3 positions shown · non-contrast
Comparison: None.

CLINICAL DATA: Tenderness about the left tibial tuberosity.

EXAM:
LEFT KNEE - 3 VIEW

[knee ap]
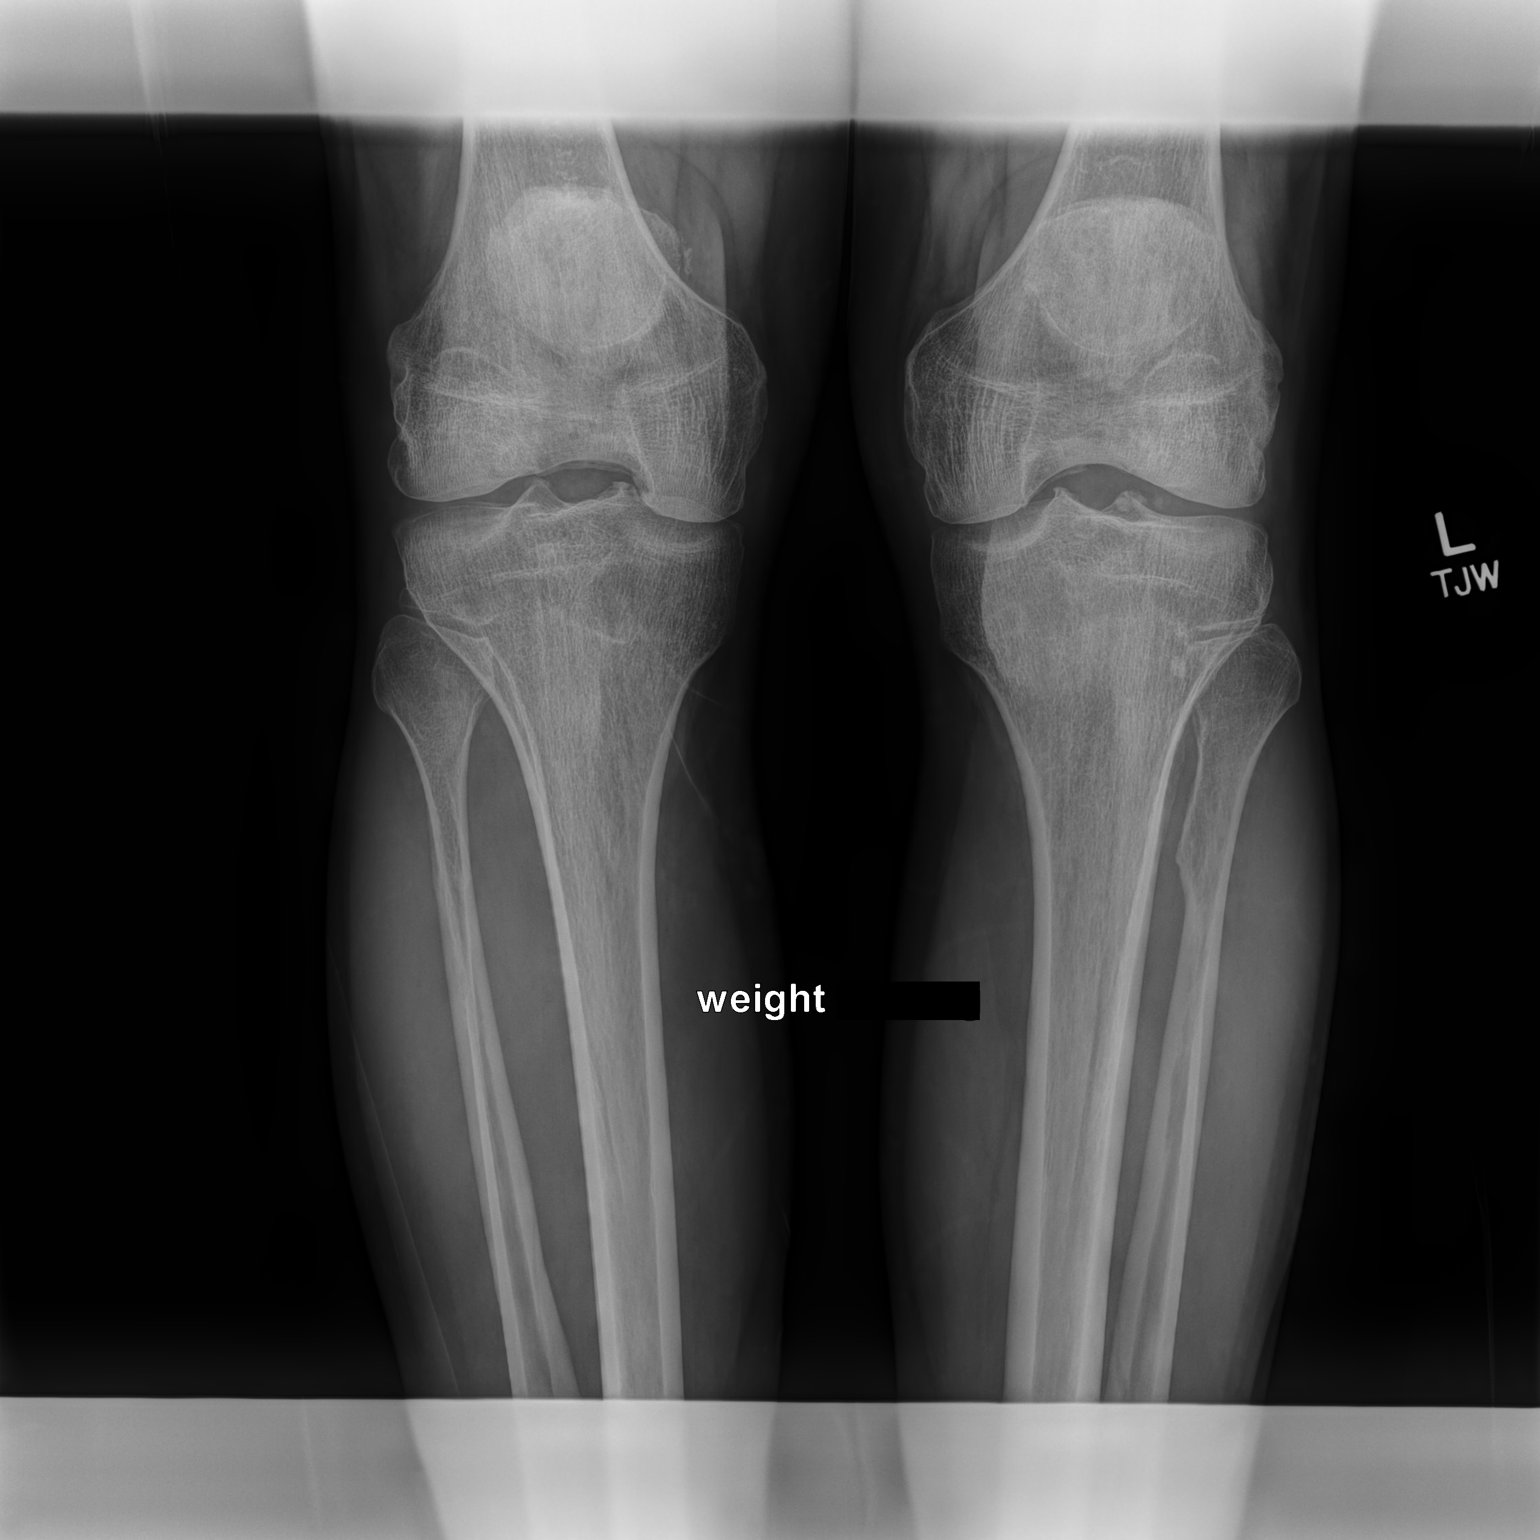

[knee lat]
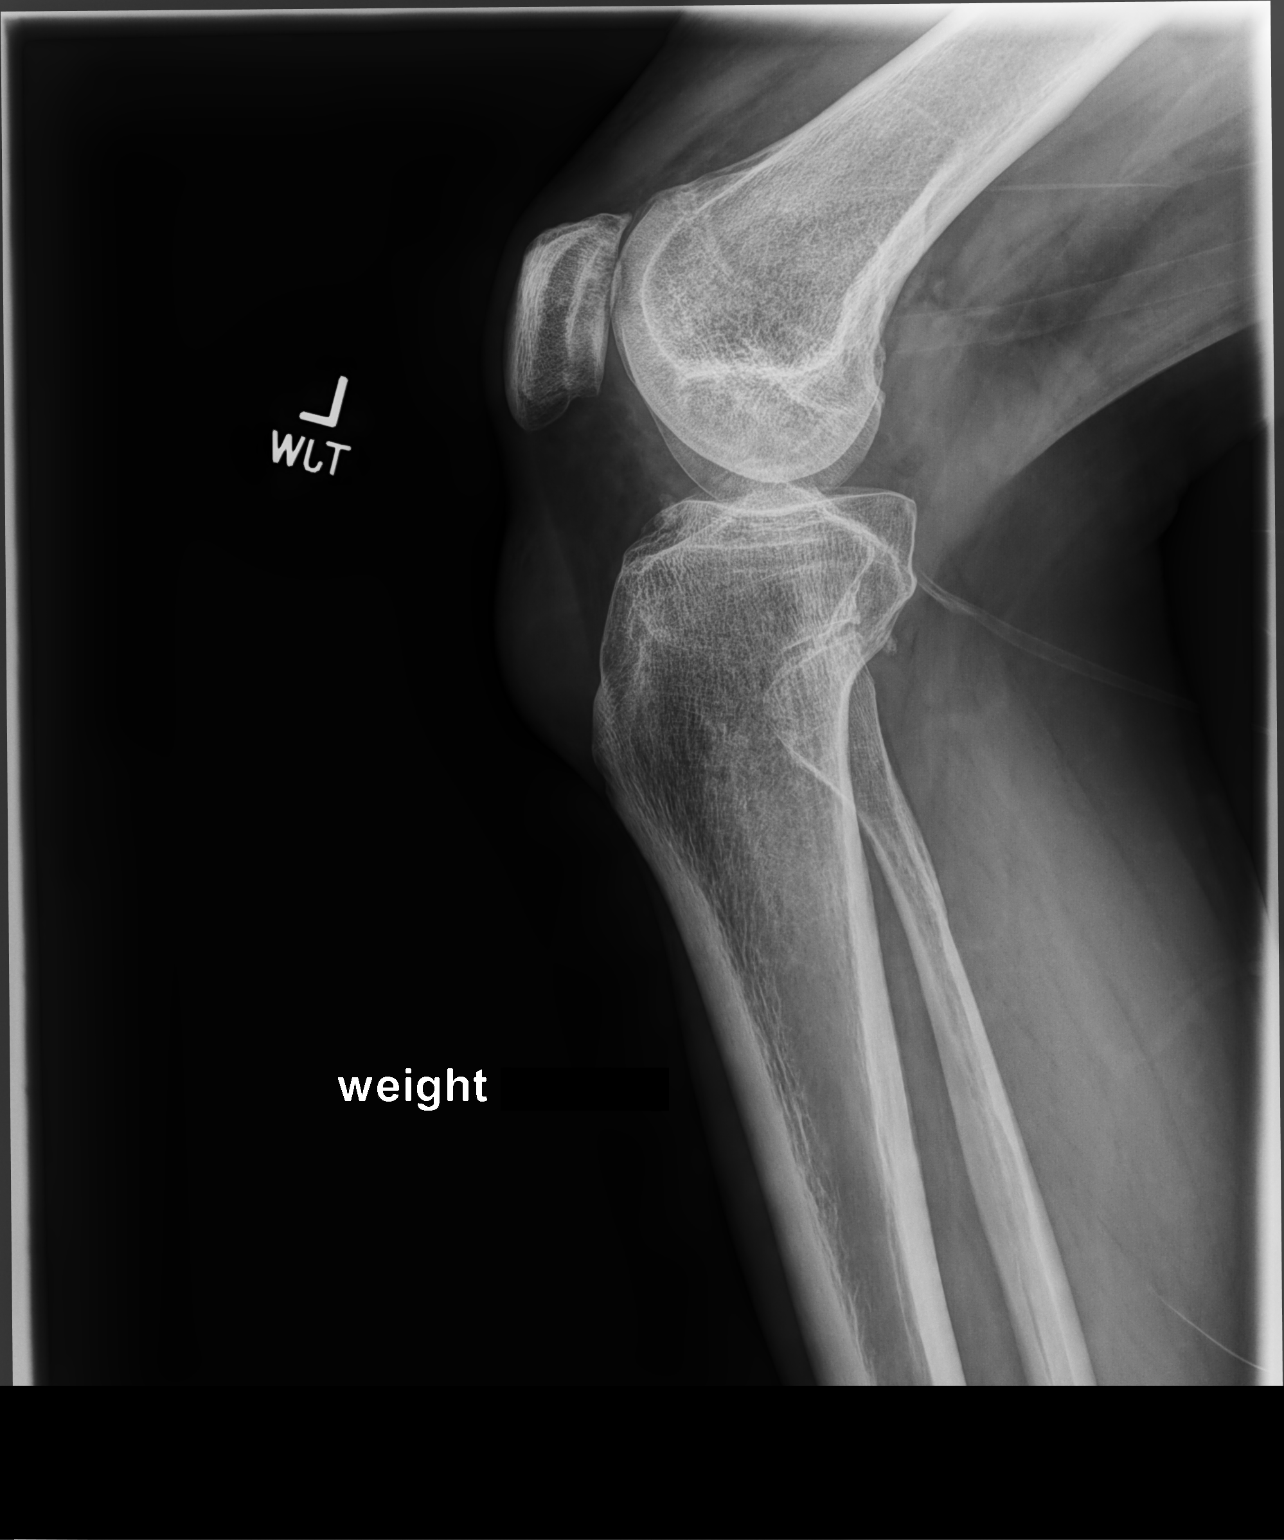

[patella (sunrise)]
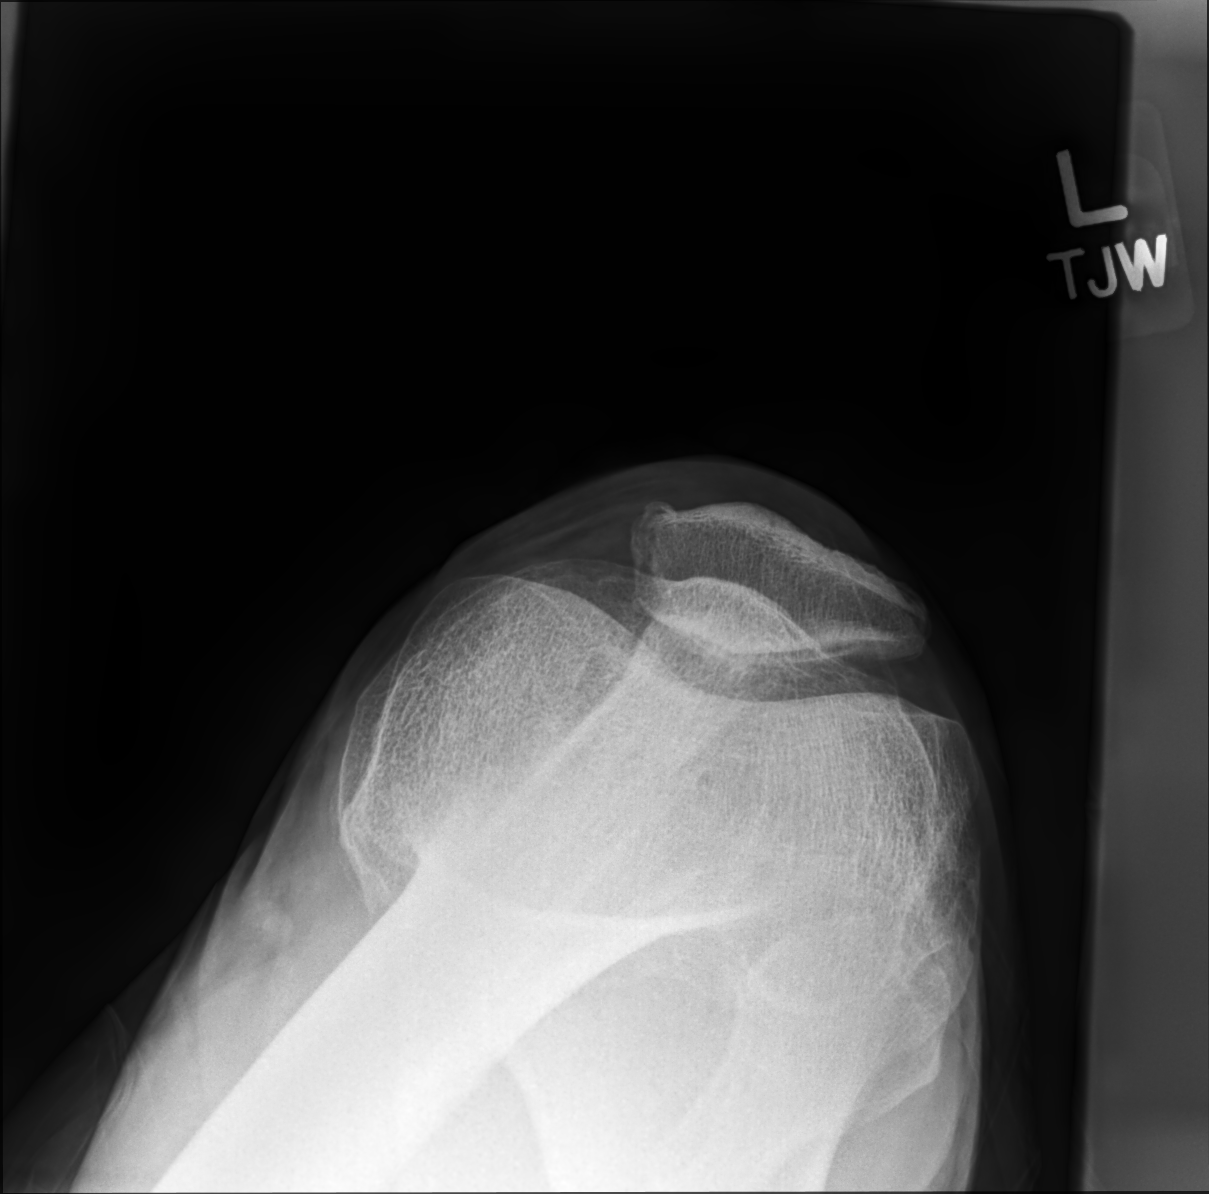

[3 of 3 positions shown; findings below may reference images not displayed]

FINDINGS: There is focal soft tissue swelling about the left tibial tuberosity
and patellar tendon without associated fracture or dislocation. No
joint effusion or evidence of lipohemarthrosis. No radiopaque
foreign body.

Mild tricompartmental degenerative change of the left knee is
suspected with joint space loss, subchondral sclerosis and
osteophytosis. No definite evidence of chondrocalcinosis.

Limited visualization of the contralateral right knee suggests
similar mild degenerative change involving both the medial and
lateral compartments.
IMPRESSION: 1. Focal soft tissue swelling about the tibial tuberosity and
patellar tendon without associated fracture or radiopaque foreign
body.
2. Mild tricompartmental degenerative change of the left knee.

## 2023-02-11 DIAGNOSIS — M546 Pain in thoracic spine: Secondary | ICD-10-CM | POA: Diagnosis not present

## 2023-02-11 DIAGNOSIS — M9903 Segmental and somatic dysfunction of lumbar region: Secondary | ICD-10-CM | POA: Diagnosis not present

## 2023-02-11 DIAGNOSIS — M9901 Segmental and somatic dysfunction of cervical region: Secondary | ICD-10-CM | POA: Diagnosis not present

## 2023-02-11 DIAGNOSIS — M9902 Segmental and somatic dysfunction of thoracic region: Secondary | ICD-10-CM | POA: Diagnosis not present

## 2023-02-18 DIAGNOSIS — M9903 Segmental and somatic dysfunction of lumbar region: Secondary | ICD-10-CM | POA: Diagnosis not present

## 2023-02-18 DIAGNOSIS — M9901 Segmental and somatic dysfunction of cervical region: Secondary | ICD-10-CM | POA: Diagnosis not present

## 2023-02-18 DIAGNOSIS — M9902 Segmental and somatic dysfunction of thoracic region: Secondary | ICD-10-CM | POA: Diagnosis not present

## 2023-02-18 DIAGNOSIS — M546 Pain in thoracic spine: Secondary | ICD-10-CM | POA: Diagnosis not present

## 2023-02-25 DIAGNOSIS — M546 Pain in thoracic spine: Secondary | ICD-10-CM | POA: Diagnosis not present

## 2023-02-25 DIAGNOSIS — M9903 Segmental and somatic dysfunction of lumbar region: Secondary | ICD-10-CM | POA: Diagnosis not present

## 2023-02-25 DIAGNOSIS — M9901 Segmental and somatic dysfunction of cervical region: Secondary | ICD-10-CM | POA: Diagnosis not present

## 2023-02-25 DIAGNOSIS — M9902 Segmental and somatic dysfunction of thoracic region: Secondary | ICD-10-CM | POA: Diagnosis not present

## 2023-03-11 ENCOUNTER — Encounter: Payer: Self-pay | Admitting: Family Medicine

## 2023-03-14 ENCOUNTER — Other Ambulatory Visit: Payer: Self-pay | Admitting: Family Medicine

## 2023-03-14 DIAGNOSIS — M25569 Pain in unspecified knee: Secondary | ICD-10-CM

## 2023-03-19 DIAGNOSIS — M170 Bilateral primary osteoarthritis of knee: Secondary | ICD-10-CM | POA: Diagnosis not present

## 2023-03-19 DIAGNOSIS — M25869 Other specified joint disorders, unspecified knee: Secondary | ICD-10-CM | POA: Diagnosis not present

## 2023-04-02 ENCOUNTER — Other Ambulatory Visit: Payer: Self-pay | Admitting: Physician Assistant

## 2023-04-02 DIAGNOSIS — M25862 Other specified joint disorders, left knee: Secondary | ICD-10-CM

## 2023-04-17 ENCOUNTER — Encounter: Payer: Self-pay | Admitting: Physician Assistant

## 2023-04-19 DIAGNOSIS — J101 Influenza due to other identified influenza virus with other respiratory manifestations: Secondary | ICD-10-CM | POA: Diagnosis not present

## 2023-04-19 DIAGNOSIS — Z6827 Body mass index (BMI) 27.0-27.9, adult: Secondary | ICD-10-CM | POA: Diagnosis not present

## 2023-04-19 DIAGNOSIS — Z789 Other specified health status: Secondary | ICD-10-CM | POA: Diagnosis not present

## 2023-04-25 DIAGNOSIS — Z6828 Body mass index (BMI) 28.0-28.9, adult: Secondary | ICD-10-CM | POA: Diagnosis not present

## 2023-04-25 DIAGNOSIS — H9202 Otalgia, left ear: Secondary | ICD-10-CM | POA: Diagnosis not present

## 2023-04-26 ENCOUNTER — Ambulatory Visit
Admission: RE | Admit: 2023-04-26 | Discharge: 2023-04-26 | Disposition: A | Discharge status: Home / Self Care | Payer: 59 | Source: Ambulatory Visit | Attending: Physician Assistant | Admitting: Physician Assistant

## 2023-04-26 DIAGNOSIS — M25862 Other specified joint disorders, left knee: Secondary | ICD-10-CM

## 2023-04-26 DIAGNOSIS — M25562 Pain in left knee: Secondary | ICD-10-CM | POA: Diagnosis not present

## 2023-04-26 DIAGNOSIS — M23322 Other meniscus derangements, posterior horn of medial meniscus, left knee: Secondary | ICD-10-CM | POA: Diagnosis not present

## 2023-04-26 DIAGNOSIS — R2242 Localized swelling, mass and lump, left lower limb: Secondary | ICD-10-CM | POA: Diagnosis not present

## 2023-04-26 MED ORDER — GADOPICLENOL 0.5 MMOL/ML IV SOLN
10.0000 mL | Freq: Once | INTRAVENOUS | Status: AC | PRN
  Administered 2023-04-26: 10 mL via INTRAVENOUS

## 2023-05-05 ENCOUNTER — Other Ambulatory Visit: Payer: Self-pay | Admitting: Family Medicine

## 2023-05-05 DIAGNOSIS — Z8639 Personal history of other endocrine, nutritional and metabolic disease: Secondary | ICD-10-CM

## 2023-05-05 DIAGNOSIS — Z125 Encounter for screening for malignant neoplasm of prostate: Secondary | ICD-10-CM

## 2023-05-06 ENCOUNTER — Other Ambulatory Visit: Payer: Self-pay

## 2023-05-06 ENCOUNTER — Encounter: Payer: Self-pay | Admitting: Family Medicine

## 2023-05-06 MED ORDER — SILDENAFIL CITRATE 20 MG PO TABS: 60.0000 mg | ORAL_TABLET | Freq: Every day | ORAL | 0 refills | Status: DC | PRN

## 2023-05-09 ENCOUNTER — Other Ambulatory Visit (INDEPENDENT_AMBULATORY_CARE_PROVIDER_SITE_OTHER): Payer: 59

## 2023-05-09 DIAGNOSIS — Z125 Encounter for screening for malignant neoplasm of prostate: Secondary | ICD-10-CM

## 2023-05-09 DIAGNOSIS — Z8639 Personal history of other endocrine, nutritional and metabolic disease: Secondary | ICD-10-CM

## 2023-05-09 LAB — CBC WITH DIFFERENTIAL/PLATELET
Basophils Absolute: 0 10*3/uL (ref 0.0–0.1)
Basophils Relative: 0.8 % (ref 0.0–3.0)
Eosinophils Absolute: 0.2 10*3/uL (ref 0.0–0.7)
Eosinophils Relative: 3.5 % (ref 0.0–5.0)
HCT: 47.2 % (ref 39.0–52.0)
Hemoglobin: 15.7 g/dL (ref 13.0–17.0)
Lymphocytes Relative: 26.2 % (ref 12.0–46.0)
Lymphs Abs: 1.3 10*3/uL (ref 0.7–4.0)
MCHC: 33.3 g/dL (ref 30.0–36.0)
MCV: 93.4 fL (ref 78.0–100.0)
Monocytes Absolute: 0.4 10*3/uL (ref 0.1–1.0)
Monocytes Relative: 7.7 % (ref 3.0–12.0)
Neutro Abs: 3 10*3/uL (ref 1.4–7.7)
Neutrophils Relative %: 61.8 % (ref 43.0–77.0)
Platelets: 236 10*3/uL (ref 150.0–400.0)
RBC: 5.06 Mil/uL (ref 4.22–5.81)
RDW: 12.8 % (ref 11.5–15.5)
WBC: 4.9 10*3/uL (ref 4.0–10.5)

## 2023-05-09 LAB — COMPREHENSIVE METABOLIC PANEL
ALT: 18 U/L (ref 0–53)
AST: 20 U/L (ref 0–37)
Albumin: 4.3 g/dL (ref 3.5–5.2)
Alkaline Phosphatase: 79 U/L (ref 39–117)
BUN: 25 mg/dL — ABNORMAL HIGH (ref 6–23)
CO2: 29 meq/L (ref 19–32)
Calcium: 9.2 mg/dL (ref 8.4–10.5)
Chloride: 104 meq/L (ref 96–112)
Creatinine, Ser: 1.36 mg/dL (ref 0.40–1.50)
GFR: 56.46 mL/min — ABNORMAL LOW (ref >60.00)
Glucose, Bld: 93 mg/dL (ref 70–99)
Potassium: 4.3 meq/L (ref 3.5–5.1)
Sodium: 141 meq/L (ref 135–145)
Total Bilirubin: 0.8 mg/dL (ref 0.2–1.2)
Total Protein: 6.4 g/dL (ref 6.0–8.3)

## 2023-05-09 LAB — LIPID PANEL
Cholesterol: 170 mg/dL (ref 0–200)
HDL: 42 mg/dL (ref >39.00)
LDL Cholesterol: 116 mg/dL — ABNORMAL HIGH (ref 0–99)
NonHDL: 127.74
Total CHOL/HDL Ratio: 4
Triglycerides: 57 mg/dL (ref 0.0–149.0)
VLDL: 11.4 mg/dL (ref 0.0–40.0)

## 2023-05-09 LAB — PSA: PSA: 0.64 ng/mL (ref 0.10–4.00)

## 2023-05-15 DIAGNOSIS — M25862 Other specified joint disorders, left knee: Secondary | ICD-10-CM | POA: Diagnosis not present

## 2023-05-17 ENCOUNTER — Encounter: Payer: Self-pay | Admitting: Family Medicine

## 2023-05-17 ENCOUNTER — Ambulatory Visit (INDEPENDENT_AMBULATORY_CARE_PROVIDER_SITE_OTHER): Payer: 59 | Admitting: Family Medicine

## 2023-05-17 VITALS — BP 118/68 | HR 80 | Temp 98.2°F | Ht 73.0 in | Wt 205.0 lb

## 2023-05-17 DIAGNOSIS — Z0000 Encounter for general adult medical examination without abnormal findings: Secondary | ICD-10-CM

## 2023-05-17 DIAGNOSIS — Z7189 Other specified counseling: Secondary | ICD-10-CM

## 2023-05-17 DIAGNOSIS — M7989 Other specified soft tissue disorders: Secondary | ICD-10-CM

## 2023-05-17 MED ORDER — SILDENAFIL CITRATE 20 MG PO TABS: 60.0000 mg | ORAL_TABLET | Freq: Every day | ORAL | 12 refills | Status: DC | PRN

## 2023-05-17 NOTE — Patient Instructions (Addendum)
Call up here on Monday about the [facility] referral.  Either way, let me know.  Take care.  Glad to see you.

## 2023-05-17 NOTE — Progress Notes (Unsigned)
CPE- See plan.  Routine anticipatory guidance given to patient.  See health maintenance.  The possibility exists that previously documented standard health maintenance information may have been brought forward from a previous encounter into this note.  If needed, that same information has been updated to reflect the current situation based on today's encounter.    Tetanus 2014 Flu shot d/w pt.  PNA not due.  Shingles d/w pt.   covid vaccine prev encouraged.   Colonoscopy 2021 PSA 2025 Diet and exercise d/w pt.   HIV and HCV screening prev neg.  Living will d/w pt. He wants his neighbor Biagio Borg designated if patient were incapacitated.    Defer any intervention re: the above until his knee is addressed.   He had the flu recently but recovered.   Labs d/w pt.   He has a longstanding soft tissue mass on the left knee.  He had MRI done in the midst of meniscus w/u.  He had knee pain with flexion.  Soft tissue mass was characterized on the MRI.  MRI IMPRESSION: 1. 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 2. Oblique tear of the posterior horn-body junction of the medial meniscus extending to the inferior articular surface. 3. High-grade partial-thickness cartilage loss of the superior aspect of the medial and lateral patellar facets with subchondral marrow edema. 4. Mild partial-thickness cartilage loss of the weight-bearing medial femorotibial compartment and mild subchondral marrow edema along the periphery of the medial tibial plateau.  The soft tissue mass has been present for >10 years per patient.  Discussed with patient about previous imaging.  D/w pt about ED and sildenafil use.  No NTG.    PMH and SH reviewed  Meds, vitals, and allergies reviewed.   ROS: Per HPI.  Unless specifically indicated otherwise in HPI, the patient denies:  General: fever. Eyes: acute vision changes ENT: sore  throat Cardiovascular: chest pain Respiratory: SOB GI: vomiting GU: dysuria Musculoskeletal: acute back pain Derm: acute rash Neuro: acute motor dysfunction Psych: worsening mood Endocrine: polydipsia Heme: bleeding Allergy: hayfever  GEN: nad, alert and oriented HEENT: ncat NECK: supple w/o LA CV: rrr. PULM: ctab, no inc wob ABD: soft, +bs EXT: no edema SKIN: no acute rash Puffy area just medial to L tibial tubercle.    The 10-year ASCVD risk score (Arnett DK, et al., 2019) is: 7.5%   Values used to calculate the score:     Age: 63 years     Sex: Male     Is Non-Hispanic African American: No     Diabetic: No     Tobacco smoker: No     Systolic Blood Pressure: 119 mmHg     Is BP treated: No     HDL Cholesterol: 42 mg/dL     Total Cholesterol: 170 mg/dL

## 2023-05-19 NOTE — Assessment & Plan Note (Signed)
Living will d/w pt. He wants his neighbor Barbara Carter designated if patient were incapacitated.   

## 2023-05-19 NOTE — Assessment & Plan Note (Signed)
Tetanus 2014 Flu shot d/w pt.  PNA not due.  Shingles d/w pt.   covid vaccine prev encouraged.   Colonoscopy 2021 PSA 2025 Diet and exercise d/w pt.   HIV and HCV screening prev neg.  Living will d/w pt. He wants his neighbor Biagio Borg designated if patient were incapacitated.    Defer any intervention re: the above until his knee is addressed.   He had the flu recently but recovered.

## 2023-05-19 NOTE — Assessment & Plan Note (Signed)
Had been present for years.  Was thought to be a lipoma.  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  He has been referred to [facility] in the meantime.  He has not yet heard about that appointment.  I asked him to update me on Monday if he does/does not have referral in place.  Discussed that he is likely going to need a biopsy, potentially excisional biopsy.  I think it makes sense to defer any other health maintenance issues until this is addressed.  He agrees.

## 2023-06-12 DIAGNOSIS — M25462 Effusion, left knee: Secondary | ICD-10-CM | POA: Diagnosis not present

## 2023-06-12 DIAGNOSIS — M7122 Synovial cyst of popliteal space [Baker], left knee: Secondary | ICD-10-CM | POA: Diagnosis not present

## 2023-06-12 DIAGNOSIS — R2242 Localized swelling, mass and lump, left lower limb: Secondary | ICD-10-CM | POA: Diagnosis not present

## 2023-06-12 DIAGNOSIS — M7989 Other specified soft tissue disorders: Secondary | ICD-10-CM | POA: Diagnosis not present

## 2023-07-21 ENCOUNTER — Telehealth: Payer: Self-pay | Admitting: Family Medicine

## 2023-07-21 NOTE — Telephone Encounter (Signed)
 See phone note

## 2023-07-21 NOTE — Telephone Encounter (Signed)
 Please call [facility] and see what they can do in terms of his soft tissue mass at the knee.  He has been seen there and also at [facility].  He wants to have this removed and I want to find out if anyone at [facility] can help him.  Thanks.  Please let me know what you hear.

## 2023-07-23 NOTE — Telephone Encounter (Signed)
 I did speak with [facility] and they did advise that the removal of a soft tissue mass is something that can be handled there.

## 2023-07-25 DIAGNOSIS — M5416 Radiculopathy, lumbar region: Secondary | ICD-10-CM | POA: Diagnosis not present

## 2023-07-25 DIAGNOSIS — M9903 Segmental and somatic dysfunction of lumbar region: Secondary | ICD-10-CM | POA: Diagnosis not present

## 2023-07-25 DIAGNOSIS — M9904 Segmental and somatic dysfunction of sacral region: Secondary | ICD-10-CM | POA: Diagnosis not present

## 2023-07-25 DIAGNOSIS — M62830 Muscle spasm of back: Secondary | ICD-10-CM | POA: Diagnosis not present

## 2023-07-28 NOTE — Telephone Encounter (Signed)
 Let entry.  I talked with patient by phone after clinic on 07/26/23.  D/w pt about AV's message and I am going to check with Dr. Hyacinth Meeker at [facility].

## 2023-07-29 DIAGNOSIS — M9904 Segmental and somatic dysfunction of sacral region: Secondary | ICD-10-CM | POA: Diagnosis not present

## 2023-07-29 DIAGNOSIS — M62830 Muscle spasm of back: Secondary | ICD-10-CM | POA: Diagnosis not present

## 2023-07-29 DIAGNOSIS — M9903 Segmental and somatic dysfunction of lumbar region: Secondary | ICD-10-CM | POA: Diagnosis not present

## 2023-07-29 DIAGNOSIS — M5416 Radiculopathy, lumbar region: Secondary | ICD-10-CM | POA: Diagnosis not present

## 2023-07-29 NOTE — Telephone Encounter (Signed)
 Mychart message sent to patient.   "I called [facility] and they are going to see about getting you scheduled with Dr. Hyacinth Meeker. You should get a call about that. If you don't get a call in the next few days, then let me know. Take care. "  Appreciate the help of all involved.

## 2023-08-02 DIAGNOSIS — M7042 Prepatellar bursitis, left knee: Secondary | ICD-10-CM | POA: Diagnosis not present

## 2023-08-22 ENCOUNTER — Ambulatory Visit: Admitting: Family Medicine

## 2023-08-22 ENCOUNTER — Encounter: Payer: Self-pay | Admitting: Family Medicine

## 2023-08-22 VITALS — BP 118/60 | HR 68 | Temp 97.6°F | Ht 73.0 in | Wt 204.6 lb

## 2023-08-22 DIAGNOSIS — M7989 Other specified soft tissue disorders: Secondary | ICD-10-CM | POA: Diagnosis not present

## 2023-08-22 DIAGNOSIS — L989 Disorder of the skin and subcutaneous tissue, unspecified: Secondary | ICD-10-CM | POA: Diagnosis not present

## 2023-08-22 NOTE — Patient Instructions (Signed)
 The spot should blister and then heal over.  Cover with a bandaid if needed.  Take care.  Glad to see you.

## 2023-08-22 NOTE — Progress Notes (Signed)
 He is likely going to put off his knee procedure until next year, d/w pt.  He saw ortho in the meantime.  He didn't notice a change in the lesion.  He can still play pickleball w/o pain.    Skin lesion on the L chest wall.  SK.  Discussed options.  See exam.  Meds, vitals, and allergies reviewed.   ROS: Per HPI unless specifically indicated in ROS section   No apparent distress. Normocephalic/atraumatic. Multiple SKs noted on the trunk.  He has a 6 x 4 mm benign-appearing seborrheic keratosis and also 10 x 5 mm seborrheic keratosis on the left chest wall.  Multiple similar seborrheic keratoses on the back.  The 10 x 5 mm lesion has a central pedunculated area that can get irritated.  Discussed options for treatment.

## 2023-08-25 DIAGNOSIS — L989 Disorder of the skin and subcutaneous tissue, unspecified: Secondary | ICD-10-CM | POA: Insufficient documentation

## 2023-08-25 NOTE — Assessment & Plan Note (Signed)
 He is likely going to put off his knee procedure until next year, d/w pt.  He saw ortho in the meantime.  He didn't notice a change in the lesion.  He can still play pickleball w/o pain.

## 2023-08-25 NOTE — Assessment & Plan Note (Signed)
 These look like benign appearing seborrheic keratoses.  Discussed the one that has the pedunculated central area.  Options given for treatment.  He opted for liquid nitrogen treatment.  Verbal consent obtained.  Frozen x 3 at office visit with adequate freeze/thaw cycle.  No complications.  Tolerated well.  Routine instructions given to patient.  It should blister and then heal over.  He can bandage the area with a Band-Aid as needed in the meantime.  Update me as needed.  He agrees to plan.

## 2023-11-28 ENCOUNTER — Encounter: Payer: Self-pay | Admitting: Family Medicine

## 2023-11-28 ENCOUNTER — Ambulatory Visit: Admitting: Family Medicine

## 2023-11-28 VITALS — BP 106/68 | HR 61 | Temp 97.7°F | Ht 73.0 in | Wt 203.0 lb

## 2023-11-28 DIAGNOSIS — H6990 Unspecified Eustachian tube disorder, unspecified ear: Secondary | ICD-10-CM | POA: Insufficient documentation

## 2023-11-28 NOTE — Progress Notes (Signed)
 D/w pt about his L knee- puffiness hasn't changed.    Prev imaging d/w pt. IMPRESSION: Nonenhancing prepatellar soft tissue mass is most consistent with a hematoma and/or hemorrhagic bursitis.   He is considering options about having that addressed later on.   Some fatigue a few days ago, around the time of sx onset.  Prev with pain near R ear- that resolved. Voice is still slightly hoarse but throat isn't sore now.  No L sided pain.  No FCVD.  No cough.  No sputum.  No rhinorrhea.  He hasn't been out of work.    He has still been exercising at baseline.    Meds, vitals, and allergies reviewed.   ROS: Per HPI unless specifically indicated in ROS section   Nad Ncat Neck supple, no LA Rrr Ctab L knee anterior puffiness at baseline.  TM with normal inspection B, w/o erythema, but B ETD on Valsalva. MMM OP wnl

## 2023-11-28 NOTE — Patient Instructions (Addendum)
 Drink plenty of fluid, gently try to pop your ears, and rest your voice.  Take care.  Glad to see you.

## 2023-12-01 DIAGNOSIS — H6990 Unspecified Eustachian tube disorder, unspecified ear: Secondary | ICD-10-CM | POA: Insufficient documentation

## 2023-12-01 NOTE — Assessment & Plan Note (Addendum)
 Likely eustachian tube dysfunction from a self resolving viral illness.  Supportive care.  Gently perform Valsalva.  Update me as needed.  Anatomy discussed with patient.

## 2024-04-13 ENCOUNTER — Telehealth: Payer: Self-pay | Admitting: Family Medicine

## 2024-04-13 NOTE — Telephone Encounter (Signed)
 Norman from [facility] radiology calling back; Dr Waymon wants to speak with Dr Cleatus; no further info availab.e. Dr Cleatus will speak with Dr Waymon sending to dr Cleatus.

## 2024-04-13 NOTE — Telephone Encounter (Signed)
 Please call [phone] to speak to Dr Rea Parent @ [facility] regarding this patient. They called wanting to speak to provider directly

## 2024-04-14 ENCOUNTER — Telehealth: Payer: Self-pay

## 2024-04-14 NOTE — Telephone Encounter (Signed)
 Called patient reviewed all information and repeated back to me. Will call if any questions.  Patient states no change in size or symptoms. He states that he has evaluation by Dr. Cleotilde and there was a high cost. He is getting new insurance and will see who is covered with that and let us  know.

## 2024-04-14 NOTE — Telephone Encounter (Signed)
 Please call patient.  I was called by radiologist at [facility].  She had gone back and looked at his previous MRI.  She reread the scans and at this point felt the lesion near his knee was more likely to be an atypical fatty mass instead of just bursitis.  Please get update from patient.  Has the lesion changed size?  How is he feeling?  Does he have plans to get it removed?  Does he need a referral to get it removed?  Please let me know.  Thanks.

## 2024-04-14 NOTE — Telephone Encounter (Signed)
 Copied from CRM #[id number]. Topic: Clinical - Request for Lab/Test Order >> Apr 14, 2024 11:52 AM Christopher Gray wrote: Reason for CRM: Patient has physical 05/18/2024. Would like lab orders and lab appointment before the visit. Prefers morning for fasting labs. Patient CB# [phone]

## 2024-04-15 ENCOUNTER — Encounter: Payer: Self-pay | Admitting: Family Medicine

## 2024-04-15 NOTE — Telephone Encounter (Signed)
 Labs ordered. Thanks!

## 2024-04-15 NOTE — Addendum Note (Signed)
 Addended by: CLEATUS ARLYSS RAMAN on: 04/15/2024 03:45 PM   Modules accepted: Orders

## 2024-05-12 ENCOUNTER — Other Ambulatory Visit (INDEPENDENT_AMBULATORY_CARE_PROVIDER_SITE_OTHER)

## 2024-05-12 DIAGNOSIS — Z8639 Personal history of other endocrine, nutritional and metabolic disease: Secondary | ICD-10-CM | POA: Diagnosis not present

## 2024-05-12 DIAGNOSIS — Z125 Encounter for screening for malignant neoplasm of prostate: Secondary | ICD-10-CM

## 2024-05-12 LAB — CBC WITH DIFFERENTIAL/PLATELET
Basophils Absolute: 0 K/uL (ref 0.0–0.1)
Basophils Relative: 0.6 % (ref 0.0–3.0)
Eosinophils Absolute: 0.2 K/uL (ref 0.0–0.7)
Eosinophils Relative: 3.9 % (ref 0.0–5.0)
HCT: 46.5 % (ref 39.0–52.0)
Hemoglobin: 15.8 g/dL (ref 13.0–17.0)
Lymphocytes Relative: 26.6 % (ref 12.0–46.0)
Lymphs Abs: 1.2 K/uL (ref 0.7–4.0)
MCHC: 34.1 g/dL (ref 30.0–36.0)
MCV: 91.6 fl (ref 78.0–100.0)
Monocytes Absolute: 0.3 K/uL (ref 0.1–1.0)
Monocytes Relative: 6.8 % (ref 3.0–12.0)
Neutro Abs: 2.7 K/uL (ref 1.4–7.7)
Neutrophils Relative %: 62.1 % (ref 43.0–77.0)
Platelets: 211 K/uL (ref 150.0–400.0)
RBC: 5.08 Mil/uL (ref 4.22–5.81)
RDW: 12.5 % (ref 11.5–15.5)
WBC: 4.4 K/uL (ref 4.0–10.5)

## 2024-05-12 LAB — COMPREHENSIVE METABOLIC PANEL WITH GFR
ALT: 22 U/L (ref 3–53)
AST: 21 U/L (ref 5–37)
Albumin: 4.1 g/dL (ref 3.5–5.2)
Alkaline Phosphatase: 74 U/L (ref 39–117)
BUN: 23 mg/dL (ref 6–23)
CO2: 30 meq/L (ref 19–32)
Calcium: 9 mg/dL (ref 8.4–10.5)
Chloride: 104 meq/L (ref 96–112)
Creatinine, Ser: 1.51 mg/dL — ABNORMAL HIGH (ref 0.40–1.50)
GFR: 49.45 mL/min — ABNORMAL LOW
Glucose, Bld: 94 mg/dL (ref 70–99)
Potassium: 4 meq/L (ref 3.5–5.1)
Sodium: 140 meq/L (ref 135–145)
Total Bilirubin: 0.7 mg/dL (ref 0.2–1.2)
Total Protein: 6.4 g/dL (ref 6.0–8.3)

## 2024-05-12 LAB — LIPID PANEL
Cholesterol: 162 mg/dL (ref 28–200)
HDL: 45.3 mg/dL
LDL Cholesterol: 104 mg/dL — ABNORMAL HIGH (ref 10–99)
NonHDL: 116.94
Total CHOL/HDL Ratio: 4
Triglycerides: 67 mg/dL (ref 10.0–149.0)
VLDL: 13.4 mg/dL (ref 0.0–40.0)

## 2024-05-12 LAB — PSA: PSA: 0.66 ng/mL (ref 0.10–4.00)

## 2024-05-14 ENCOUNTER — Ambulatory Visit: Payer: Self-pay | Admitting: Family Medicine

## 2024-05-14 DIAGNOSIS — R7989 Other specified abnormal findings of blood chemistry: Secondary | ICD-10-CM

## 2024-05-15 ENCOUNTER — Ambulatory Visit: Payer: Self-pay

## 2024-05-15 ENCOUNTER — Ambulatory Visit: Payer: Self-pay | Admitting: *Deleted

## 2024-05-15 NOTE — Telephone Encounter (Signed)
 Copied from CRM [id number]. Topic: Clinical - Lab/Test Results >> May 15, 2024  2:51 PM Rea ORN wrote: Reason for CRM: Pt would like clarification on lab results.  Per chart: Please call patient. We will talk about his labs in detail when he comes in. His kidney function was slightly worse on his most recent labs. This is not an emergency. I will recheck it when he comes in for the office visit. Please make sure he drinks plenty of fluid prior to the office visit. Thanks.   Attempted to contact patient with provider response- no answer- left message to call office

## 2024-05-15 NOTE — Telephone Encounter (Signed)
 3rd CB attempt. LVM to call office back    Reason for CRM: Pt would like clarification on lab results.   Per chart: Please call patient. We will talk about his labs in detail when he comes in. His kidney function was slightly worse on his most recent labs. This is not an emergency. I will recheck it when he comes in for the office visit. Please make sure he drinks plenty of fluid prior to the office visit. Thanks.

## 2024-05-15 NOTE — Telephone Encounter (Signed)
 Called patient reviewed all information and repeated back to me. Will call if any questions.  ? ?

## 2024-05-15 NOTE — Telephone Encounter (Signed)
 Reason for Disposition  Caller requesting routine or non-urgent lab result  Answer Assessment - Initial Assessment Questions 1. REASON FOR CALL or QUESTION: What is your reason for calling today? or How can I best     Name of labs that reflect kidney function 2. CALLER: Document the source of call. (e.g., laboratory staff, caregiver or patient).     patient  Answer Assessment - Initial Assessment Questions 1. REASON FOR CALL: What is the main reason for your call? or How can I best help you?     Pt wanting to know which lab work indicates kidney function  2. OTHER QUESTIONS: Do you have any other questions?     Pt wanted to know if taking creatine monohydrate powder is bad for kidneys  Protocols used: Information Only Call - No Triage-A-AH, PCP Call - No Triage-A-AH

## 2024-05-15 NOTE — Telephone Encounter (Signed)
 Second attempt to call patient- no answer- left message to call office  Reason for CRM: Pt would like clarification on lab results.   Per chart: Please call patient. We will talk about his labs in detail when he comes in. His kidney function was slightly worse on his most recent labs. This is not an emergency. I will recheck it when he comes in for the office visit. Please make sure he drinks plenty of fluid prior to the office visit. Thanks.

## 2024-05-15 NOTE — Telephone Encounter (Signed)
 FYI Only or Action Required?: Action required by provider: clinical question for provider and lab or test result follow-up needed.  Patient was last seen in primary care on 11/28/2023 by Cleatus Arlyss RAMAN, MD.  Called Nurse Triage reporting Advice Only.  Symptoms began n/a pt not triaged .  Interventions attempted: Other: n/a.  Symptoms are: n/a.  Triage Disposition: Call PCP When Office is Open  Patient/caregiver understands and will follow disposition?: yes pt has f/u appt 05/18/24.

## 2024-05-15 NOTE — Telephone Encounter (Signed)
 Would not take extra creatinine.  Would drink enough water to keep urine clear or light colored.  Thanks.

## 2024-05-15 NOTE — Telephone Encounter (Signed)
 Next Appt With Family Medicine Frost Solian, MD) 05/18/2024 at 3:30 PM

## 2024-05-18 ENCOUNTER — Encounter: Payer: Self-pay | Admitting: Family Medicine

## 2024-05-18 ENCOUNTER — Ambulatory Visit (INDEPENDENT_AMBULATORY_CARE_PROVIDER_SITE_OTHER): Admitting: Family Medicine

## 2024-05-18 VITALS — BP 118/62 | HR 66 | Temp 98.1°F | Ht 71.0 in | Wt 204.4 lb

## 2024-05-18 DIAGNOSIS — N529 Male erectile dysfunction, unspecified: Secondary | ICD-10-CM

## 2024-05-18 DIAGNOSIS — M7989 Other specified soft tissue disorders: Secondary | ICD-10-CM

## 2024-05-18 DIAGNOSIS — Z0000 Encounter for general adult medical examination without abnormal findings: Secondary | ICD-10-CM | POA: Diagnosis not present

## 2024-05-18 DIAGNOSIS — Z23 Encounter for immunization: Secondary | ICD-10-CM

## 2024-05-18 DIAGNOSIS — R7989 Other specified abnormal findings of blood chemistry: Secondary | ICD-10-CM

## 2024-05-18 DIAGNOSIS — Z7189 Other specified counseling: Secondary | ICD-10-CM

## 2024-05-18 MED ORDER — SILDENAFIL CITRATE 20 MG PO TABS: 60.0000 mg | ORAL_TABLET | Freq: Every day | ORAL | 12 refills | Status: AC | PRN

## 2024-05-18 NOTE — Telephone Encounter (Signed)
 Patient has been given his results in another TE note.

## 2024-05-18 NOTE — Progress Notes (Unsigned)
 CPE- See plan.  Routine anticipatory guidance given to patient.  See health maintenance.  The possibility exists that previously documented standard health maintenance information may have been brought forward from a previous encounter into this note.  If needed, that same information has been updated to reflect the current situation based on today's encounter.    Tetanus 2026 Flu shot d/w pt.  PNA not due.  Shingles d/w pt.   covid vaccine prev encouraged.   Colonoscopy 2021 PSA 2026 Diet and exercise d/w pt.   HIV and HCV screening prev neg.  Living will d/w pt. He wants his neighbor Heron Pepper designated if patient were incapacitated.    Recheck Cr pending.  He stopped creatinine supplement in the meantime. No nsaids.  He stopped drinking soda in the meantime.   Leg lesion d/w pt.  D/w pt about prev message from [facility] and ortho follow up.   ED, using sildenafil  prn.    PMH and SH reviewed  Meds, vitals, and allergies reviewed.   ROS: Per HPI.  Unless specifically indicated otherwise in HPI, the patient denies:  General: fever. Eyes: acute vision changes ENT: sore throat Cardiovascular: chest pain Respiratory: SOB GI: vomiting GU: dysuria Musculoskeletal: acute back pain Derm: acute rash Neuro: acute motor dysfunction Psych: worsening mood Endocrine: polydipsia Heme: bleeding Allergy: hayfever  GEN: nad, alert and oriented HEENT: mucous membranes moist NECK: supple w/o LA CV: rrr. PULM: ctab, no inc wob ABD: soft, +bs EXT: no edema SKIN: no acute rash

## 2024-05-18 NOTE — Patient Instructions (Addendum)
 Go to the lab on the way out.   If you have mychart we'll likely use that to update you.    Take care.  Glad to see you. Let me know if you can't get an appointment with ortho.

## 2024-05-19 LAB — BASIC METABOLIC PANEL WITH GFR
BUN: 25 mg/dL — ABNORMAL HIGH (ref 6–23)
CO2: 28 meq/L (ref 19–32)
Calcium: 9 mg/dL (ref 8.4–10.5)
Chloride: 105 meq/L (ref 96–112)
Creatinine, Ser: 1.41 mg/dL (ref 0.40–1.50)
GFR: 53.68 mL/min — ABNORMAL LOW
Glucose, Bld: 86 mg/dL (ref 70–99)
Potassium: 4 meq/L (ref 3.5–5.1)
Sodium: 140 meq/L (ref 135–145)

## 2024-05-20 ENCOUNTER — Ambulatory Visit

## 2024-05-20 ENCOUNTER — Telehealth: Payer: Self-pay

## 2024-05-20 ENCOUNTER — Ambulatory Visit: Payer: Self-pay | Admitting: Family Medicine

## 2024-05-20 ENCOUNTER — Encounter: Payer: Self-pay | Admitting: Family Medicine

## 2024-05-20 VITALS — BP 147/71 | HR 79 | Ht 71.0 in | Wt 208.0 lb

## 2024-05-20 DIAGNOSIS — M7989 Other specified soft tissue disorders: Secondary | ICD-10-CM

## 2024-05-20 DIAGNOSIS — M25562 Pain in left knee: Secondary | ICD-10-CM

## 2024-05-20 DIAGNOSIS — R7989 Other specified abnormal findings of blood chemistry: Secondary | ICD-10-CM | POA: Insufficient documentation

## 2024-05-20 DIAGNOSIS — G8929 Other chronic pain: Secondary | ICD-10-CM

## 2024-05-20 NOTE — Assessment & Plan Note (Signed)
 D/w pt about prev message from [facility] and ortho follow up.  He does not have any drainage from the area but it is still puffy.  Refer back to orthopedics.

## 2024-05-20 NOTE — Telephone Encounter (Signed)
 [facility] is only one that takes [employer]

## 2024-05-20 NOTE — Telephone Encounter (Signed)
 Is there anyone with [facility] we can send him to?

## 2024-05-20 NOTE — Progress Notes (Signed)
 "  Office Visit Note   Patient: Christopher Gray           Date of Birth: May 07, 1960           MRN: [id number] Visit Date: 05/20/2024              Requested by: Cleatus Arlyss RAMAN, MD [address] [place],  [place] [address] PCP: Cleatus Arlyss RAMAN, MD   Assessment & Plan: Visit Diagnoses:  1. Soft tissue mass   2. Chronic pain of left knee     Plan: Patient previously seen at [facility] and [facility] for soft tissue mass and there was some concern by [facility] radiologist for atypical fatty tumor according to notes from PCP. Patient would like mass to be removed. Was not originally removed at [facility] as patient had concern about the cost of surgery but has new insurance. Recommended the patient follow up with previous orthopaedic specialist at [facility] to discuss removal.  Orders:  Orders Placed This Encounter  Procedures   DG Knee 3 Views Left     Subjective: Chief Complaint: Left knee mass  HPI Patient is a 64 y.o. year old male who presents with a left knee mass. Has had mass presents for many years. Has no pain associated with mass. Was seen by [facility] and [facility] about mass and elected not to have it removed at first due to cost at [facility]. 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 Presents to discuss removal of mass  Objective: Vital Signs: BP (!) 143/79   Pulse 81   Ht 5' 11 (1.803 m)   Wt 94.3 kg   BMI 29.01 kg/m   Physical Exam Gen: Alert, No Acute Distress left knee: Skin intact, no erythema or induration noted. 7.5x5 cm soft mass present just distal to joint line medial to tibial tubercle. NTTP. Full knee ROM  Imaging: Radiographs personally reviewed by me; reveal anterior soft tissue swelling as well as mild degenerative changes of the left knee  MRI: Narrative & Impression  CLINICAL DATA:  Left knee pain   EXAM: MRI OF THE LEFT KNEE WITHOUT AND WITH CONTRAST   TECHNIQUE: Multiplanar, multisequence MR imaging of the left knee was  performed both before and after administration of intravenous contrast.   CONTRAST:  10 mL Vueway , 0 mL wasted   COMPARISON:  None Available.   FINDINGS: MENISCI   Medial: Oblique tear of the posterior horn-body junction of the medial meniscus extending to the inferior articular surface.   Lateral: Intact.   LIGAMENTS   Cruciates: ACL and PCL are intact.   Collaterals: Medial collateral ligament is intact. Lateral collateral ligament complex is intact.   CARTILAGE   Patellofemoral: High-grade partial-thickness cartilage loss of the superior aspect of the medial and lateral patellar facets with subchondral marrow edema.   Medial: Mild partial-thickness cartilage loss of the weight-bearing medial femorotibial compartment and mild subchondral marrow edema along the periphery of the medial tibial plateau.   Lateral:  No chondral defect.   JOINT: Moderate joint effusion. Mild edema in Hoffa's fat. No plical thickening.   POPLITEAL FOSSA: Popliteus tendon is intact. Small Baker's cyst.   EXTENSOR MECHANISM: Intact quadriceps tendon. Intact patellar tendon. Intact lateral patellar retinaculum. Intact medial patellar retinaculum. Intact MPFL.   BONES: No aggressive osseous lesion. No fracture or dislocation.   Other: Muscles are normal. 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   IMPRESSION: 1. 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 2. Oblique tear of the posterior horn-body junction of the medial meniscus extending to the inferior articular surface. 3. High-grade partial-thickness cartilage loss of the superior aspect of the medial and lateral patellar facets with subchondral marrow edema. 4. Mild  partial-thickness cartilage loss of the weight-bearing medial femorotibial compartment and mild subchondral marrow edema along the periphery of the medial tibial plateau.     Electronically Signed   By: Julaine Blanch M.D.   On: 05/09/2023 14:15    PMFS History: Patient Active Problem List   Diagnosis Date Noted   ETD (eustachian tube dysfunction) 12/01/2023   Skin lesion 08/25/2023   Mood change 09/27/2021   Soft tissue mass 09/13/2021   Left knee pain 05/14/2021   Rash 05/14/2021   Shoulder pain 05/14/2021   Dry eye 05/14/2021   Skin change 05/16/2020   Hx of cold sores 05/10/2019   History of DVT in adulthood 11/04/2016   Advance care planning 12/02/2014   Routine general medical examination at a health care facility 07/12/2011   ED (erectile dysfunction) 07/12/2011   Past Medical History:  Diagnosis Date   Allergy    Cataracts, bilateral    Clotting disorder    on 325 mg ASA daily for Past hx DVT left    DVT (deep venous thrombosis) (HCC)    1st event 2018.  no clear trigger.     ED (erectile dysfunction)    Hyperlipidemia     Family History  Problem Relation Age of Onset   Cancer Mother        bone cancer, unknown primary cancer   Cancer Father        lung cancer with brain mets, prior smoker   Stroke Father    Cancer Sister        Ovarian cancer   Prostate cancer Neg Hx    Colon cancer Neg Hx    Esophageal cancer Neg Hx    Rectal cancer Neg Hx    Stomach cancer Neg Hx    Deep vein thrombosis Neg Hx    Colon polyps Neg Hx     Past Surgical History:  Procedure Laterality Date   CATARACT EXTRACTION, BILATERAL     COLONOSCOPY     POLYPECTOMY     TENDON REPAIR  1999   Crushed right little finger   WISDOM TOOTH EXTRACTION     Social History   Occupational History   Not on file  Tobacco Use   Smoking status: Never   Smokeless tobacco: Never  Vaping Use   Vaping status: Not on file  Substance and Sexual Activity   Alcohol use: Yes    Comment:  occassional    Drug use: No   Sexual activity: Not on file   Current Outpatient Medications  Medication Instructions   aspirin 325 mg, Daily   b complex vitamins capsule 1 capsule, Daily   Cholecalciferol (VITAMIN D) 50 MCG (2000 UT) CAPS Take by mouth.   Claritin 10 mg, Daily PRN   FiberCon 625 mg, Daily   Magnesium Glycinate 120 MG CAPS    multivitamin-lutein (OCUVITE-LUTEIN) CAPS capsule    Omega-3 Fatty Acids (FISH OIL) 1000 MG CAPS 1 capsule, Every morning   sildenafil  (REVATIO ) 60-100 mg,  Oral, Daily PRN   triamcinolone  cream (KENALOG ) 0.1 % 1 application , Topical, 2 times daily   Vitamin C  1,000 mg, Oral, Daily   Allergies as of 05/20/2024 - Review Complete 05/18/2024  Allergen Reaction Noted   Effexor  [venlafaxine ]  05/14/2022   "

## 2024-05-20 NOTE — Assessment & Plan Note (Signed)
 Recheck Cr pending.  He stopped creatinine supplement in the meantime. No nsaids.  He stopped drinking soda in the meantime.  See notes on labs.

## 2024-05-20 NOTE — Assessment & Plan Note (Signed)
Living will d/w pt. He wants his neighbor Barbara Carter designated if patient were incapacitated.   

## 2024-05-20 NOTE — Assessment & Plan Note (Signed)
 Tetanus 2026 Flu shot d/w pt.  PNA not due.  Shingles d/w pt.   covid vaccine prev encouraged.   Colonoscopy 2021 PSA 2026 Diet and exercise d/w pt.   HIV and HCV screening prev neg.  Living will d/w pt. He wants his neighbor Heron Pepper designated if patient were incapacitated.

## 2024-05-20 NOTE — Telephone Encounter (Signed)
 Please advise.

## 2024-05-20 NOTE — Patient Instructions (Addendum)
 Please call your previous [facility] surgeon for an appointment to discuss surgical removal of tumor:  Visgauss, Recardo Morrison, MD  [address] MEDICINE [place], [place] [address]  Phone: [phone]  Phone: [phone] fax:[phone]   Customer service team at [phone] (local) or [phone] (toll-free) between 8:00 am and 5:00 pm Monday, Tuesday, Wednesday, and Friday, or from 8:00 am to 4:00 pm Thursday.

## 2024-05-20 NOTE — Telephone Encounter (Signed)
 Patient called. Says [facility] is out of network with his insurance. He would like another doctor to see. [phone]

## 2024-05-20 NOTE — Telephone Encounter (Signed)
 Referral placed

## 2024-05-20 NOTE — Assessment & Plan Note (Signed)
 Continue prn. Sildenafil.

## 2024-05-20 NOTE — Telephone Encounter (Signed)
 I called and advised pt . He is concerned [facility] doesn't take his insurance. He said he heard only [facility] does. Samule can you advise on this

## 2024-05-21 NOTE — Telephone Encounter (Signed)
 Sari is there anything we can do to help get his insurance to approve going to [facility]?

## 2024-05-22 ENCOUNTER — Other Ambulatory Visit: Payer: Self-pay | Admitting: Physician Assistant

## 2024-05-22 ENCOUNTER — Encounter: Payer: Self-pay | Admitting: Physician Assistant

## 2024-05-22 ENCOUNTER — Telehealth: Payer: Self-pay | Admitting: Physician Assistant

## 2024-05-22 DIAGNOSIS — M7989 Other specified soft tissue disorders: Secondary | ICD-10-CM

## 2024-05-22 NOTE — Telephone Encounter (Signed)
 Called patient. Informed patient that I had called [facility], found a general and oncological surgery practice that is approved by his insurance ([facility] [place] office). Called practice. Confirmed insurance coverage. Faxed referral. Provided patient with office's phone number, [phone]. Advised patient to call next week if he had not heard from them yet.

## 2024-05-22 NOTE — Telephone Encounter (Signed)
 Called [facility]. Found number via [facility] website, utilized phone number for individual plans. Spoke to multiple different departments; provider prior-auth, coverage/benefits, and member resources [phone]  Eventually reached Donnice, with member resources, who stated that he could not find an orthopedist or oncologist that patient was approved to be seen by.  Transferred/referred to [facility] provider service desk. Talked to Aran. Patient has no orthopedic oncologists listed. Approved for orthopedic surgery by [facility] On[facility] and [place]. Stated those were all [place] providers. Patient is approved to be seen by Dr. Ozell Flake and Dr. Earnestine Blanch in [place], [place]. Both sports orthopedic surgeons. Asked about approved oncologists - Marin stated there was a list of oncologic general surgeons.  Oncology:  Dr. Corlis Monks Dr. Lorre Mancel Sharps  Call reference [phone]  Phone call took 51 minutes.  Both physicians listed as affiliated with [facility] general surgery per google search. Called number online [phone]. Both providers are no longer at this office. Receptionist, Katheryn, stated that their practice did accept [facility]. Provided fax number for referral. Receptionist also stated that within practice was an oncologist who could assist as necessary.  Fax # [phone]  Faxed referral.

## 2024-05-22 NOTE — Telephone Encounter (Signed)
 Sam could you help with the letter of necessity?

## 2024-05-22 NOTE — Telephone Encounter (Signed)
 Contacted patient's insurance, [employer] [facility]). Was able to find general surgeon/oncological surgeon that is approved by patient's insurance, [facility] in [place], [place]. Called office. Confirmed patient's insurance coverage. Faxed referral. Called patient and let him know about referral; also provided him with office's phone number and advised him to call if he had not received a call from them by next week.

## 2024-05-27 ENCOUNTER — Other Ambulatory Visit: Payer: Self-pay | Admitting: Physician Assistant

## 2024-05-27 DIAGNOSIS — M7989 Other specified soft tissue disorders: Secondary | ICD-10-CM

## 2024-05-27 DIAGNOSIS — G8929 Other chronic pain: Secondary | ICD-10-CM

## 2024-05-27 NOTE — Telephone Encounter (Signed)
 Pt requested a call back in regard to the message below.

## 2024-05-27 NOTE — Telephone Encounter (Signed)
 Called and advised pt.

## 2024-05-27 NOTE — Telephone Encounter (Signed)
 Called patient. Patient stated that [facility] called him and stated he needed a new referral for orthopedic oncology, not general surgery.  Called referral coordinator. [phone]. Cancelled general surgery referral. Given phone number for orthopedic oncology team.   Called [facility] orthopedic oncology dept at [phone]. Given fax number of [phone]. Faxed new referral. Patient should expect call in a few days about scheduling an appointment.  *Autumn, could you please call and let patient know? Thank you!

## 2025-05-13 ENCOUNTER — Other Ambulatory Visit

## 2025-05-20 ENCOUNTER — Encounter: Admitting: Family Medicine
